# Patient Record
Sex: Male | Born: 1950 | Race: White | Hispanic: No | Marital: Married | Smoking: Never smoker
Health system: Southern US, Community
[De-identification: ages and names within clinical notes are randomized; demographics above are authoritative.]

---

## 2013-10-22 DIAGNOSIS — I10 Essential (primary) hypertension: Secondary | ICD-10-CM | POA: Insufficient documentation

## 2013-10-22 DIAGNOSIS — J309 Allergic rhinitis, unspecified: Secondary | ICD-10-CM | POA: Insufficient documentation

## 2014-04-22 DIAGNOSIS — Q809 Congenital ichthyosis, unspecified: Secondary | ICD-10-CM | POA: Insufficient documentation

## 2019-05-16 DIAGNOSIS — Z8719 Personal history of other diseases of the digestive system: Secondary | ICD-10-CM | POA: Insufficient documentation

## 2019-05-16 DIAGNOSIS — K7689 Other specified diseases of liver: Secondary | ICD-10-CM | POA: Insufficient documentation

## 2019-06-13 DIAGNOSIS — K8020 Calculus of gallbladder without cholecystitis without obstruction: Secondary | ICD-10-CM | POA: Insufficient documentation

## 2020-07-07 NOTE — Progress Notes (Signed)
[facility] [address] [place] [address] Phone: [phone] Subjective:   Clarence Walters, am serving as a scribe for Dr. Antoine Primas. This visit occurred during the SARS-CoV-2 public health emergency.  Safety protocols were in place, including screening questions prior to the visit, additional usage of staff PPE, and extensive cleaning of exam room while observing appropriate contact time as indicated for disinfecting solutions.    I'm seeing this patient by the request  of:  Woodroe Chen, MD  CC: Bilateral shoulder, hand and knee pain  PPI:[id number]  Clarence Walters is a 70 y.o. male coming in with complaint of R heel, B shoulder, B hand and knee pain. Patient states that his most pressing issue is his R heel pain. Pain his entire life but has gotten worse recently. Pain over medial aspect of calcaneous. Patient wears CROCS in the house. Stretching has not helped his pain. Has tried numerous arch supports that make his pain worse.   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 Had to have clavicle straps to hold shoulders in.   Pain over 5th metacarpal for 2 years after digging 280 feet in his yard. Has nodules along flexor tendons bilaterally.   Pain in both knees with deep knee bending and using ladders. L>R. Pain in L knee is on medial aspect. Wears brace on this knee for pain management.     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  Reviewed patient's most recent labs from outside facility, overall unremarkable.  No past medical history on file.  Social History   Socioeconomic History   Marital status: Married    Spouse name: Not on file   Number of children: Not on file   Years of education: Not on file   Highest education level: Not on file  Occupational History   Not on file  Tobacco Use   Smoking status: Not on file    Smokeless tobacco: Not on file  Substance and Sexual Activity   Alcohol use: Not on file   Drug use: Not on file   Sexual activity: Not on file  Other Topics Concern   Not on file  Social History Narrative   Not on file   Social Determinants of Health   Financial Resource Strain: Not on file  Food Insecurity: Not on file  Transportation Needs: Not on file  Physical Activity: Not on file  Stress: Not on file  Social Connections: Not on file   Not on File No family history on file.   Current Outpatient Medications (Cardiovascular):    amLODipine-atorvastatin (CADUET) 5-10 MG tablet, Take 1 tablet by mouth daily.   lisinopril (ZESTRIL) 40 MG tablet, Take 40 mg by mouth daily.      Reviewed prior external information including notes and imaging from  primary care provider As well as notes that were available from care everywhere and other healthcare systems.  Past medical history, social, surgical and family history all reviewed in electronic medical record.  No pertanent information unless stated regarding to the chief complaint.   Review of Systems:  No headache, visual changes, nausea, vomiting, diarrhea, constipation, dizziness, abdominal pain, skin rash, fevers, chills, night sweats, weight loss, swollen lymph nodes, joint swelling, chest pain, shortness of breath, mood changes. POSITIVE muscle aches, body aches  Objective  Blood pressure 120/86, pulse 73, height 5\' 11"  (1.803 m), weight 168 lb (76.2 kg), SpO2  97 %.   General: No apparent distress alert and oriented x3 mood and affect normal, dressed appropriately.  HEENT: Pupils equal, extraocular movements intact  Respiratory: Patient's speak in full sentences and does not appear short of breath  Cardiovascular: No lower extremity edema, non tender, no erythema  Gait normal with good balance and coordination.  MSK: Bilateral shoulders do show that patient does have excessive range of motion but patient does have  less range of motion on the left side.  Positive impingement noted.  Patient is in pain over the acromioclavicular joint noted.  Mild swelling noted compared to the contralateral side. Knee exam is fairly unremarkable with mild lateral tracking of the patella noted. Right heel exam severely tender to palpation over the plantar aspect of the heel.  Patient does have mild tightness noted of the posterior cord.  Limited muscular skeletal ultrasound was performed and interpreted by Antoine Primas, M  Limited ultrasound of patient's left shoulder shows rotator cuff does appear to be significantly intact.  Patient does have the potential findings of the glenohumeral head and neck that are consistent with a Hill-Sachs deformity. Patient does have moderate to severe narrowing of the acromioclavicular joint with mild effusion noted.  Regarding patient's foot does show that patient does have inflammation consistent with plantar fasciitis with hypoechoic changes.  Possible cortical irregularity noted of the calcaneal bone noted.  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    Impression and Recommendations:     The above documentation has been reviewed and is accurate and complete Judi Saa, DO

## 2020-07-08 ENCOUNTER — Other Ambulatory Visit: Payer: Self-pay

## 2020-07-08 ENCOUNTER — Encounter: Payer: Self-pay | Admitting: Family Medicine

## 2020-07-08 ENCOUNTER — Ambulatory Visit (INDEPENDENT_AMBULATORY_CARE_PROVIDER_SITE_OTHER): Payer: Medicare Other | Admitting: Family Medicine

## 2020-07-08 ENCOUNTER — Ambulatory Visit (INDEPENDENT_AMBULATORY_CARE_PROVIDER_SITE_OTHER): Payer: Medicare Other

## 2020-07-08 ENCOUNTER — Ambulatory Visit: Payer: Self-pay

## 2020-07-08 VITALS — BP 120/86 | HR 73 | Ht 71.0 in | Wt 168.0 lb

## 2020-07-08 DIAGNOSIS — M2550 Pain in unspecified joint: Secondary | ICD-10-CM | POA: Diagnosis not present

## 2020-07-08 DIAGNOSIS — M25571 Pain in right ankle and joints of right foot: Secondary | ICD-10-CM

## 2020-07-08 DIAGNOSIS — G8929 Other chronic pain: Secondary | ICD-10-CM | POA: Diagnosis not present

## 2020-07-08 DIAGNOSIS — M722 Plantar fascial fibromatosis: Secondary | ICD-10-CM

## 2020-07-08 DIAGNOSIS — M79642 Pain in left hand: Secondary | ICD-10-CM | POA: Diagnosis not present

## 2020-07-08 DIAGNOSIS — E559 Vitamin D deficiency, unspecified: Secondary | ICD-10-CM | POA: Diagnosis not present

## 2020-07-08 DIAGNOSIS — E038 Other specified hypothyroidism: Secondary | ICD-10-CM

## 2020-07-08 DIAGNOSIS — M79641 Pain in right hand: Secondary | ICD-10-CM

## 2020-07-08 DIAGNOSIS — M19019 Primary osteoarthritis, unspecified shoulder: Secondary | ICD-10-CM | POA: Insufficient documentation

## 2020-07-08 LAB — IBC PANEL
Iron: 112 ug/dL (ref 42–165)
Saturation Ratios: 45.5 % (ref 20.0–50.0)
Transferrin: 176 mg/dL — ABNORMAL LOW (ref 212.0–360.0)

## 2020-07-08 LAB — COMPREHENSIVE METABOLIC PANEL
ALT: 18 U/L (ref 0–53)
AST: 23 U/L (ref 0–37)
Albumin: 4.8 g/dL (ref 3.5–5.2)
Alkaline Phosphatase: 65 U/L (ref 39–117)
BUN: 17 mg/dL (ref 6–23)
CO2: 28 mEq/L (ref 19–32)
Calcium: 9.4 mg/dL (ref 8.4–10.5)
Chloride: 106 mEq/L (ref 96–112)
Creatinine, Ser: 1.33 mg/dL (ref 0.40–1.50)
GFR: 55.18 mL/min — ABNORMAL LOW (ref >60.00)
Glucose, Bld: 93 mg/dL (ref 70–99)
Potassium: 4.1 mEq/L (ref 3.5–5.1)
Sodium: 144 mEq/L (ref 135–145)
Total Bilirubin: 1.2 mg/dL (ref 0.2–1.2)
Total Protein: 7.4 g/dL (ref 6.0–8.3)

## 2020-07-08 LAB — CBC WITH DIFFERENTIAL/PLATELET
Basophils Absolute: 0 10*3/uL (ref 0.0–0.1)
Basophils Relative: 0.5 % (ref 0.0–3.0)
Eosinophils Absolute: 0 10*3/uL (ref 0.0–0.7)
Eosinophils Relative: 0.6 % (ref 0.0–5.0)
HCT: 47.4 % (ref 39.0–52.0)
Hemoglobin: 16.1 g/dL (ref 13.0–17.0)
Lymphocytes Relative: 21.9 % (ref 12.0–46.0)
Lymphs Abs: 1.4 10*3/uL (ref 0.7–4.0)
MCHC: 33.9 g/dL (ref 30.0–36.0)
MCV: 96.2 fl (ref 78.0–100.0)
Monocytes Absolute: 0.5 10*3/uL (ref 0.1–1.0)
Monocytes Relative: 7.9 % (ref 3.0–12.0)
Neutro Abs: 4.5 10*3/uL (ref 1.4–7.7)
Neutrophils Relative %: 69.1 % (ref 43.0–77.0)
Platelets: 181 10*3/uL (ref 150.0–400.0)
RBC: 4.92 Mil/uL (ref 4.22–5.81)
RDW: 13.8 % (ref 11.5–15.5)
WBC: 6.5 10*3/uL (ref 4.0–10.5)

## 2020-07-08 LAB — C-REACTIVE PROTEIN: CRP: <1 mg/dL (ref 0.5–20.0)

## 2020-07-08 LAB — URIC ACID: Uric Acid, Serum: 6.3 mg/dL (ref 4.0–7.8)

## 2020-07-08 LAB — VITAMIN D 25 HYDROXY (VIT D DEFICIENCY, FRACTURES): VITD: 52.76 ng/mL (ref 30.00–100.00)

## 2020-07-08 LAB — TESTOSTERONE: Testosterone: 376.09 ng/dL (ref 300.00–890.00)

## 2020-07-08 LAB — SEDIMENTATION RATE: Sed Rate: 4 mm/hr (ref 0–20)

## 2020-07-08 LAB — TSH: TSH: 1.52 u[IU]/mL (ref 0.35–5.50)

## 2020-07-08 NOTE — Assessment & Plan Note (Signed)
We reviewed that stretching is critically important to the treatment of PF.  Reviewed footwear. Rigid soles have been shown to help with PF. Night splints can sometimes help. Reviewed rehab of stretching and calf raises.   Could benefit from a corticosteroid injection, orthotics, or other measures if conservative treatment fails. Return to clinic in 6 weeks

## 2020-07-08 NOTE — Assessment & Plan Note (Signed)
Arthritis noted.  Patient will need home exercises.  Discussed changing range of motion.  Worsening pain will consider injection at follow-up

## 2020-07-08 NOTE — Assessment & Plan Note (Signed)
Patient is having multiple different joint pain.  Seems to be mostly of the shoulders.  We will get laboratory work-up to see if anything else is contributing.  Does have arthritic changes of the acromioclavicular joint as well as the plantar fascia.  Home exercises given for both of these today.  Discussed over-the-counter medications that I think will be beneficial and we will see what laboratory work-up shows.  Discussed over-the-counter natural supplementations as well as shoes.  Follow-up with me again in 4 to 6 weeks

## 2020-07-08 NOTE — Patient Instructions (Addendum)
Xray today:  R ankle HOKA or OOFOS sandals in the house Keep hands in peripheral vision Labs today Pennsaid 2x a day then use Voltaren Ice 20 min 2x a day See me in 6 weeks

## 2020-07-10 LAB — CALCIUM, IONIZED: Calcium, Ion: 4.84 mg/dL (ref 4.8–5.6)

## 2020-07-10 LAB — CYCLIC CITRUL PEPTIDE ANTIBODY, IGG: Cyclic Citrullin Peptide Ab: <16 UNITS

## 2020-07-10 LAB — PTH, INTACT AND CALCIUM
Calcium: 9.6 mg/dL (ref 8.6–10.3)
PTH: 32 pg/mL (ref 16–77)

## 2020-07-10 LAB — ANGIOTENSIN CONVERTING ENZYME: Angiotensin-Converting Enzyme: <5 U/L — ABNORMAL LOW (ref 9–67)

## 2020-07-10 LAB — RHEUMATOID FACTOR: Rheumatoid fact SerPl-aCnc: <14 IU/mL (ref <14)

## 2020-07-10 LAB — ANA: Anti Nuclear Antibody (ANA): NEGATIVE

## 2020-08-18 NOTE — Progress Notes (Signed)
[facility] [address] [place] [address] Phone: [phone] Subjective:   Clarence Walters, am serving as a scribe for Dr. Antoine Primas.  I'm seeing this patient by the request  of:  Woodroe Chen, MD  CC: Bilateral shoulder pain, hand pain and foot pain following  NOB:[id number]  07/08/2020 We reviewed that stretching is critically important to the treatment of PF.   Reviewed footwear. Rigid soles have been shown to help with PF. Night splints can sometimes help. Reviewed rehab of stretching and calf raises.    Could benefit from a corticosteroid injection, orthotics, or other measures if conservative treatment fails. Return to clinic in 6 weeks  Patient is having multiple different joint pain.  Seems to be mostly of the shoulders.  We will get laboratory work-up to see if anything else is contributing.  Does have arthritic changes of the acromioclavicular joint as well as the plantar fascia.  Home exercises given for both of these today.  Discussed over-the-counter medications that I think will be beneficial and we will see what laboratory work-up shows.  Discussed over-the-counter natural supplementations as well as shoes.  Follow-up with me again in 4 to 6 weeks  Update 08/19/2020 Clarence Walters is a 70 y.o. male coming in with complaint of B shoulder, B hand, R foot pain. Patient states the exercises for the shoulders do not help and he does them all the time. Patient states that the shoulders click. Still having the pain in the front of shoulders. Patient states that if he does heavy lifting is what makes it worse. Patient states the foot is still the same can only be on it for short periods of time. Patient is really concerned about his feet, would like to focus on getting those better, states he can tolerate the shoulders for now.        No past medical history on file. No past surgical history on file. Social History   Socioeconomic  History   Marital status: Married    Spouse name: Not on file   Number of children: Not on file   Years of education: Not on file   Highest education level: Not on file  Occupational History   Not on file  Tobacco Use   Smoking status: Not on file   Smokeless tobacco: Not on file  Substance and Sexual Activity   Alcohol use: Not on file   Drug use: Not on file   Sexual activity: Not on file  Other Topics Concern   Not on file  Social History Narrative   Not on file   Social Determinants of Health   Financial Resource Strain: Not on file  Food Insecurity: Not on file  Transportation Needs: Not on file  Physical Activity: Not on file  Stress: Not on file  Social Connections: Not on file   Not on File No family history on file.   Current Outpatient Medications (Cardiovascular):    amLODipine-atorvastatin (CADUET) 5-10 MG tablet, Take 1 tablet by mouth daily.   lisinopril (ZESTRIL) 40 MG tablet, Take 40 mg by mouth daily.       Review of Systems:  No headache, visual changes, nausea, vomiting, diarrhea, constipation, dizziness, abdominal pain, skin rash, fevers, chills, night sweats, weight loss, swollen lymph nodes, body aches, joint swelling, chest pain, shortness of breath, mood changes. POSITIVE muscle aches  Objective  Blood pressure 112/80, pulse 76, height 5\' 11"  (1.803 m), SpO2 98 %.   General:  No apparent distress alert and oriented x3 mood and affect normal, dressed appropriately.  HEENT: Pupils equal, extraocular movements intact  Respiratory: Patient's speak in full sentences and does not appear short of breath  Cardiovascular: No lower extremity edema, non tender, no erythema  Gait normal with good balance and coordination.  MSK: Shoulder exams bilaterally show positive impingement.  Positive crossover noted.  Foot exam does show the patient does have some mild breakdown of the longitudinal arch right greater than left.  Tender to palpation over the  plantar fascia and the medial calcaneal area.  Patient also has what appears to be a small nodule noted approximately 1 cm proximal to the metatarsal heads.  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  Tender to palpation in this area.  Mild tightness noted of the posterior cord of the ankle as well.  Hand exam shows the patient does have contractures noted of the fifth fingers but does have full extension and flexion.  Nodules noted at the A2 pulley.    Impression and Recommendations:     The above documentation has been reviewed and is accurate and complete Judi Saa, DO

## 2020-08-19 ENCOUNTER — Other Ambulatory Visit: Payer: Self-pay

## 2020-08-19 ENCOUNTER — Encounter: Payer: Self-pay | Admitting: Family Medicine

## 2020-08-19 ENCOUNTER — Ambulatory Visit (INDEPENDENT_AMBULATORY_CARE_PROVIDER_SITE_OTHER): Payer: Medicare Other | Admitting: Family Medicine

## 2020-08-19 VITALS — BP 112/80 | HR 76 | Ht 71.0 in

## 2020-08-19 DIAGNOSIS — M722 Plantar fascial fibromatosis: Secondary | ICD-10-CM | POA: Diagnosis not present

## 2020-08-19 DIAGNOSIS — M216X9 Other acquired deformities of unspecified foot: Secondary | ICD-10-CM | POA: Diagnosis not present

## 2020-08-19 DIAGNOSIS — M25511 Pain in right shoulder: Secondary | ICD-10-CM | POA: Diagnosis not present

## 2020-08-19 DIAGNOSIS — M19019 Primary osteoarthritis, unspecified shoulder: Secondary | ICD-10-CM

## 2020-08-19 DIAGNOSIS — M25512 Pain in left shoulder: Secondary | ICD-10-CM | POA: Diagnosis not present

## 2020-08-19 DIAGNOSIS — M720 Palmar fascial fibromatosis [Dupuytren]: Secondary | ICD-10-CM

## 2020-08-19 NOTE — Assessment & Plan Note (Signed)
Patient does have signs and symptoms consistent with plantar fasciitis but also potential fibroma noted.  We discussed the possibility of injection which patient declined.  Due to the breakdown of the longitudinal arch we will have patient be fitted for custom orthotics.  Discussed icing regimen.  We discussed avoiding being barefoot.  Follow-up with me again in 6 weeks and if continuing to have pain we will need to consider the possibility of injection or advanced imaging.

## 2020-08-19 NOTE — Assessment & Plan Note (Signed)
Known arthritic changes noted.  Patient continues to have impingement type findings as well.  Patient declined injection and would like to start with formal physical therapy which we will refer today.  Hopefully will make some progress with this.  Worsening pain consider ultrasound and possible injections at follow-up.

## 2020-08-19 NOTE — Assessment & Plan Note (Signed)
Starting in the hands bilaterally.  We will monitor.  May need to consider the possibility of injections at follow-up.

## 2020-08-19 NOTE — Patient Instructions (Addendum)
Orthotics-They will call you PT Weyman Pedro will call you Will continue to watch hands See me in 6-8 weeks

## 2020-08-21 ENCOUNTER — Other Ambulatory Visit: Payer: Self-pay

## 2020-08-21 ENCOUNTER — Encounter: Payer: Self-pay | Admitting: Rehabilitative and Restorative Service Providers"

## 2020-08-21 ENCOUNTER — Ambulatory Visit: Payer: Medicare Other | Attending: Family Medicine | Admitting: Rehabilitative and Restorative Service Providers"

## 2020-08-21 DIAGNOSIS — R262 Difficulty in walking, not elsewhere classified: Secondary | ICD-10-CM | POA: Insufficient documentation

## 2020-08-21 DIAGNOSIS — M6281 Muscle weakness (generalized): Secondary | ICD-10-CM | POA: Insufficient documentation

## 2020-08-21 DIAGNOSIS — M25571 Pain in right ankle and joints of right foot: Secondary | ICD-10-CM | POA: Diagnosis present

## 2020-08-21 DIAGNOSIS — M25511 Pain in right shoulder: Secondary | ICD-10-CM | POA: Diagnosis present

## 2020-08-21 DIAGNOSIS — M25612 Stiffness of left shoulder, not elsewhere classified: Secondary | ICD-10-CM | POA: Insufficient documentation

## 2020-08-21 DIAGNOSIS — G8929 Other chronic pain: Secondary | ICD-10-CM | POA: Insufficient documentation

## 2020-08-21 DIAGNOSIS — M25512 Pain in left shoulder: Secondary | ICD-10-CM | POA: Diagnosis present

## 2020-08-21 NOTE — Therapy (Signed)
[facility] [address] [place], [place], [address] Phone: [phone]   Fax:  [phone]  Physical Therapy Evaluation  Patient Details  Name: Clarence Walters MRN: [id number] Date of Birth: 1950-02-09 Referring Provider (PT): Dr Antoine Primas   Encounter Date: 08/21/2020   PT End of Session - 08/21/20 1157     Visit Number 1    Date for PT Re-Evaluation 11/06/20    PT Start Time 0800    PT Stop Time 0840    PT Time Calculation (min) 40 min    Activity Tolerance Patient tolerated treatment well    Behavior During Therapy [facility] for tasks assessed/performed             History reviewed. No pertinent past medical history.  History reviewed. No pertinent surgical history.  There were no vitals filed for this visit.    Subjective Assessment - 08/21/20 0807     Subjective Pt was born with elongated tendons and reports that his mother told him that as a child he wore clavicle straps to assist.  He states that a couple of years ago, a chiropractor was able to help with the shoulder pain.  He states that last October, after moving 12 yards of mulch, he started having more pain again.  He reports that his arm 'goes out' and he puts it back again.  He states that he has lost a lot of muscle mass in the past 10 years.  He reports that he has also been having some pain in his R foot.  He states that he has plantar fascitis with a referral to the orthototist on Monday for inserts.  He states that he used to be able to ambulate 4-6 miles, but now starts having pain if he walks 2 miles.    Pertinent History Per report, elongated tendons in shoulders    Limitations Lifting    Diagnostic tests Diagnostic ultrasound by Dr Katrinka Blazing    Patient Stated Goals To decrease shoulder pain and increase strength    Currently in Pain? Yes    Pain Score 3    Pain at worst 9/10   Pain Location Shoulder    Pain Orientation Right;Left   Left shoulder worse than  Right   Pain Descriptors / Indicators Throbbing    Pain Type Chronic pain    Pain Onset More than a month ago    Pain Frequency Intermittent                [facility] PT Assessment - 08/21/20 0001       Assessment   Medical Diagnosis B shoulder pain, unspecified chronicity    Referring Provider (PT) Dr Antoine Primas    Hand Dominance Right    Next MD Visit Sept 28, 2022    Prior Therapy Chiropractor years ago      Precautions   Precautions None      Restrictions   Weight Bearing Restrictions No      Balance Screen   Has the patient fallen in the past 6 months No    Has the patient had a decrease in activity level because of a fear of falling?  No    Is the patient reluctant to leave their home because of a fear of falling?  No      Home Environment   Living Environment Private residence    Living Arrangements Spouse/significant other    Type of Home House    Home Access  Stairs to enter    Entrance Stairs-Number of Steps 3    Home Layout Two level;Able to live on main level with bedroom/bathroom      Prior Function   Level of Independence Independent    Vocation Retired    Leisure Yard Work, Remodeling, visiting family      Cognition   Overall Cognitive Status Within Functional Limits for tasks assessed      Observation/Other Assessments   Focus on Therapeutic Outcomes (FOTO)  59%      ROM / Strength   AROM / PROM / Strength AROM;Strength      AROM   AROM Assessment Site Shoulder;Ankle    Right/Left Shoulder Right;Left    Right Shoulder Flexion 130 Degrees    Right Shoulder ABduction 126 Degrees    Left Shoulder Flexion 130 Degrees    Left Shoulder ABduction 116 Degrees    Right/Left Ankle Right;Left    Right Ankle Dorsiflexion 19    Right Ankle Plantar Flexion 55    Right Ankle Inversion 40    Right Ankle Eversion 30    Left Ankle Dorsiflexion 30    Left Ankle Plantar Flexion 55    Left Ankle Inversion 40    Left Ankle Eversion 35      Strength    Overall Strength Comments B shoulder strength of 4/5 except B IR/ER of 41minus/5 with pain   R foot strength grossly 4/5 throughout                       Objective measurements completed on examination: See above findings.       [facility] Adult PT Treatment/Exercise - 08/21/20 0001       Exercises   Exercises Shoulder;Ankle      Shoulder Exercises: Standing   External Rotation Strengthening;Both;20 reps    Theraband Level (Shoulder External Rotation) Level 2 (Red)    Other Standing Exercises 3 way scapular stabilization on wall x5 each B                      PT Short Term Goals - 08/21/20 1204       PT SHORT TERM GOAL #1   Title Pt will be independent with initial HEP.    Time 2    Period Weeks    Status New               PT Long Term Goals - 08/21/20 1205       PT LONG TERM GOAL #1   Title Pt will be independent with advanced HEP.    Time 12    Period Weeks    Status New      PT LONG TERM GOAL #2   Title Pt will have shoulder and R ankle ROM WFL to allow him to reach overhead and wash his back.    Time 12    Period Weeks    Status New      PT LONG TERM GOAL #3   Title Pt will have B shoulder and R ankle strength to increase to at least 4+/5 to allow him to do yard work.    Time 12    Period Weeks    Status New      PT LONG TERM GOAL #4   Title Patient will report being able to ambulate at least 3 miles with no increase of pain to return to his prior ambulation program.    Time 12  Period Weeks    Status New      PT LONG TERM GOAL #5   Title Patient will report at least a 50% improvement in symptoms when performing yard work or remodeling.    Time 12    Period Weeks    Status New                    Plan - 08/21/20 1158     Clinical Impression Statement Clarence Walters is a 70 y.o. male who presents to PT with a referral from Dr Antoine Primas secondary to B shoulder pain.  He also presents with reports of R plantar  fasciitis. His PLOF was independent without device and able to walk 4+ miles. He presents with decreased shoulder ROM, decreased R ankle dorsiflexion, muscle weakness, increased pain, and shoulder instabliity.  He states that he notices his pain most following heavy yard work or heavy lifting.  He is going to see an orthotist on Monday for evaluation for orthotics and is hoping that this will help his R foot pain.  He would benefit from skiled PT to address his functional impairments to allow him to be able to complete his desired activities without incresaed pain.    Personal Factors and Comorbidities Comorbidity 1    Comorbidities Plantar Fasciitis    Examination-Activity Limitations Locomotion Level    Examination-Participation Restrictions Community Activity;Yard Work    Conservation officer, historic buildings Evolving/Moderate complexity    Clinical Decision Making Moderate    Rehab Potential Good    PT Frequency 2x / week    PT Duration 12 weeks    PT Treatment/Interventions ADLs/Self Care Home Management;Cryotherapy;Electrical Stimulation;Iontophoresis 4mg /ml Dexamethasone;Moist Heat;Ultrasound;Gait training;Stair training;Functional mobility training;Therapeutic activities;Therapeutic exercise;Balance training;Neuromuscular re-education;Patient/family education;Manual techniques;Passive range of motion;Dry needling;Taping;Spinal Manipulations;Joint Manipulations    PT Next Visit Plan strengthening, ROM    PT Home Exercise Plan 3 way scapular stabilization with red tband, shoulder ER    Consulted and Agree with Plan of Care Patient             Patient will benefit from skilled therapeutic intervention in order to improve the following deficits and impairments:  Difficulty walking, Increased muscle spasms, Decreased activity tolerance, Pain, Impaired flexibility, Decreased range of motion, Decreased strength, Postural dysfunction, Improper body mechanics  Visit Diagnosis: Chronic left  shoulder pain - Plan: PT plan of care cert/re-cert  Chronic right shoulder pain - Plan: PT plan of care cert/re-cert  Pain in right ankle and joints of right foot - Plan: PT plan of care cert/re-cert  Muscle weakness (generalized) - Plan: PT plan of care cert/re-cert  Difficulty in walking, not elsewhere classified - Plan: PT plan of care cert/re-cert  Stiffness of left shoulder, not elsewhere classified - Plan: PT plan of care cert/re-cert     Problem List Patient Active Problem List   Diagnosis Date Noted   Dupuytren contracture 08/19/2020   Polyarthralgia 07/08/2020   Plantar fasciitis, right 07/08/2020   Acromioclavicular arthrosis 07/08/2020   Calculus of gallbladder 06/13/2019   History of pancreatitis 05/16/2019   Liver cyst 05/16/2019   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 [place], [place], [place] Phone: [phone]   Fax:  [phone]  Name: Clarence Walters MRN: [name] Date of Birth: [birth year]

## 2020-08-24 ENCOUNTER — Encounter: Payer: Self-pay | Admitting: Family Medicine

## 2020-08-24 ENCOUNTER — Ambulatory Visit (INDEPENDENT_AMBULATORY_CARE_PROVIDER_SITE_OTHER): Payer: Medicare Other | Admitting: Family Medicine

## 2020-08-24 ENCOUNTER — Other Ambulatory Visit: Payer: Self-pay

## 2020-08-24 DIAGNOSIS — M722 Plantar fascial fibromatosis: Secondary | ICD-10-CM | POA: Diagnosis present

## 2020-08-24 NOTE — Progress Notes (Signed)
PCP: Woodroe Chen, MD  Subjective:   HPI: Patient is a 70 y.o. male here for right foot pain.  Patient has been seen by Dr. Katrinka Blazing for this issue, referred to [place] to consider custom orthotics. He reports he's had problems with his feet for as long as he can remember. Current pain plantar right foot really worsened about 10 months ago. Feels pain plantar heel but also has a spot mid-arch that is very painful. Has tried multiple different shoes and inserts without success. Has been doing home stretching exercises, using KT tape across arch.  History reviewed. No pertinent past medical history.  Current Outpatient Medications on File Prior to Visit  Medication Sig Dispense Refill   amLODipine-atorvastatin (CADUET) 5-10 MG tablet Take 1 tablet by mouth daily.     lisinopril (ZESTRIL) 40 MG tablet Take 40 mg by mouth daily.     No current facility-administered medications on file prior to visit.    History reviewed. No pertinent surgical history.  Allergies  Allergen Reactions   Pseudoephedrine Itching    Social History   Socioeconomic History   Marital status: Married    Spouse name: Not on file   Number of children: Not on file   Years of education: Not on file   Highest education level: Not on file  Occupational History   Not on file  Tobacco Use   Smoking status: Never   Smokeless tobacco: Never  Substance and Sexual Activity   Alcohol use: Not on file   Drug use: Not on file   Sexual activity: Not on file  Other Topics Concern   Not on file  Social History Narrative   Not on file   Social Determinants of Health   Financial Resource Strain: Not on file  Food Insecurity: Not on file  Transportation Needs: Not on file  Physical Activity: Not on file  Stress: Not on file  Social Connections: Not on file  Intimate Partner Violence: Not on file    History reviewed. No pertinent family history.  Ht 5\' 11"  (1.803 m)   Wt 169 lb (76.7 kg)   BMI 23.57 kg/m    [facility] Adult Exercise 08/24/2020  Frequency of strengthening activities (# of days/week) 0    No flowsheet data found.  Review of Systems: See HPI above.     Objective:  Physical Exam:  Gen: NAD, comfortable in exam room  Right foot/ankle: Cavus.  Mild transverse arch collapse.  Small nodule within medial plantar fascia with tenderness.  No other gross deformity, swelling, ecchymoses FROM without pain. TTP medial calcaneus at plantar fascia insertion and within small nodule noted above. negativ ant drawer and negative talar tilt.   Negative syndesmotic compression. Thompsons test negative. NV intact distally.   Assessment & Plan:  1. 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  Discussed options with him today.  Arch support has never worked for him in the past due to pain within medial arch at small fibroma.  We made sports insoles today with cutout for this area which felt comfortable to him in the office - no increased pain at fibroma.  Encouraged stretches and shown eccentrics as well.  Continue with taping of arch.  Encouraged f/u with Dr. 08/26/2020.  He's also considering shockwave therapy for his chronic plantar fasciitis so we discussed this as well.

## 2020-08-24 NOTE — Patient Instructions (Signed)
You have plantar fasciitis Take tylenol and/or aleve as needed for pain  Plantar fascia stretch for 20-30 seconds (do 3 of these) in morning Lowering/raise on a step exercises 3 x 10 once or twice a day - this is very important for long term recovery. Can add heel walks, toe walks forward and backward as well Ice heel for 15 minutes as needed. Avoid flat shoes/barefoot walking as much as possible. Arch straps have been shown to help with pain or the KT taping. Inserts are important (dr. Jari Sportsman active series, spencos, our green insoles, custom orthotics). Steroid injection is a consideration for short term pain relief if you are struggling - consider this where you have the fibroma. Shockwave therapy is also an option, costs 60 dollars per treatment and usually done with 3-4 treatments - let me know if you want to do this and we will start it. Physical therapy is also an option.

## 2020-08-25 ENCOUNTER — Ambulatory Visit: Payer: Medicare Other | Admitting: Physical Therapy

## 2020-08-25 ENCOUNTER — Encounter: Payer: Self-pay | Admitting: Physical Therapy

## 2020-08-25 DIAGNOSIS — M25571 Pain in right ankle and joints of right foot: Secondary | ICD-10-CM

## 2020-08-25 DIAGNOSIS — M25512 Pain in left shoulder: Secondary | ICD-10-CM

## 2020-08-25 DIAGNOSIS — G8929 Other chronic pain: Secondary | ICD-10-CM

## 2020-08-25 DIAGNOSIS — M25612 Stiffness of left shoulder, not elsewhere classified: Secondary | ICD-10-CM

## 2020-08-25 DIAGNOSIS — M6281 Muscle weakness (generalized): Secondary | ICD-10-CM

## 2020-08-25 NOTE — Therapy (Signed)
[facility] [place] [address] [place], [place], [address] Phone: [phone]   Fax:  [phone]  Physical Therapy Treatment  Patient Details  Name: Clarence Walters MRN: [id number] Date of Birth: 1950-08-09 Referring Provider (PT): Dr Antoine Primas   Encounter Date: 08/25/2020   PT End of Session - 08/25/20 0838     Visit Number 2    Date for PT Re-Evaluation 11/06/20    PT Start Time 0800    PT Stop Time 0840    PT Time Calculation (min) 40 min    Activity Tolerance Patient tolerated treatment well    Behavior During Therapy [facility] for tasks assessed/performed             History reviewed. No pertinent past medical history.  History reviewed. No pertinent surgical history.  There were no vitals filed for this visit.   Subjective Assessment - 08/25/20 0802     Subjective Doing the exercises, cant ell the different in his shoulders    Currently in Pain? No/denies                               [facility] Adult PT Treatment/Exercise - 08/25/20 0001       Shoulder Exercises: Standing   External Rotation Strengthening;Both;20 reps;Theraband    Theraband Level (Shoulder External Rotation) Level 3 (Green)    Internal Rotation Strengthening;Both;20 reps;Theraband    Theraband Level (Shoulder Internal Rotation) Level 3 (Green)    Flexion Strengthening;Both;Weights;20 reps    Shoulder Flexion Weight (lbs) 3    Extension Weights;Strengthening;Both;20 reps    Extension Weight (lbs) 10    Other Standing Exercises Tricep Ext 25lb 2x15    Other Standing Exercises Curls 15lb 2x15      Shoulder Exercises: ROM/Strengthening   Nustep L5 x 6 min    Other ROM/Strengthening Exercises Rows & Lats 25lb 2x10                      PT Short Term Goals - 08/21/20 1204       PT SHORT TERM GOAL #1   Title Pt will be independent with initial HEP.    Time 2    Period Weeks    Status New               PT  Long Term Goals - 08/21/20 1205       PT LONG TERM GOAL #1   Title Pt will be independent with advanced HEP.    Time 12    Period Weeks    Status New      PT LONG TERM GOAL #2   Title Pt will have shoulder and R ankle ROM WFL to allow him to reach overhead and wash his back.    Time 12    Period Weeks    Status New      PT LONG TERM GOAL #3   Title Pt will have B shoulder and R ankle strength to increase to at least 4+/5 to allow him to do yard work.    Time 12    Period Weeks    Status New      PT LONG TERM GOAL #4   Title Patient will report being able to ambulate at least 3 miles with no increase of pain to return to his prior ambulation program.    Time 12    Period Weeks  Status New      PT LONG TERM GOAL #5   Title Patient will report at least a 50% improvement in symptoms when performing yard work or remodeling.    Time 12    Period Weeks    Status New                   Plan - 08/25/20 4403     Clinical Impression Statement Pt tolerated an initial progression to TE well. Cues to retract shoulders with seated rows. Some increase is shoulder fatigue with and burning with internal and external rotation. Pt reports a clicking in his shoulders with standing shoulder flexion and abduction.    Personal Factors and Comorbidities Comorbidity 1    Comorbidities Plantar Fasciitis    Examination-Activity Limitations Locomotion Level    Examination-Participation Restrictions Community Activity;Yard Work    Stability/Clinical Decision Making Evolving/Moderate complexity    Rehab Potential Good    PT Frequency 2x / week    PT Treatment/Interventions ADLs/Self Care Home Management;Cryotherapy;Electrical Stimulation;Iontophoresis 4mg /ml Dexamethasone;Moist Heat;Ultrasound;Gait training;Stair training;Functional mobility training;Therapeutic activities;Therapeutic exercise;Balance training;Neuromuscular re-education;Patient/family education;Manual techniques;Passive range  of motion;Dry needling;Taping;Spinal Manipulations;Joint Manipulations    PT Next Visit Plan strengthening, ROM             Patient will benefit from skilled therapeutic intervention in order to improve the following deficits and impairments:  Difficulty walking, Increased muscle spasms, Decreased activity tolerance, Pain, Impaired flexibility, Decreased range of motion, Decreased strength, Postural dysfunction, Improper body mechanics  Visit Diagnosis: Chronic left shoulder pain  Chronic right shoulder pain  Pain in right ankle and joints of right foot  Muscle weakness (generalized)  Stiffness of left shoulder, not elsewhere classified     Problem List Patient Active Problem List   Diagnosis Date Noted   Dupuytren contracture 08/19/2020   Polyarthralgia 07/08/2020   Plantar fasciitis, right 07/08/2020   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 [place], [place], [place] Phone: [phone]   Fax:  [phone]  Name: Clarence Walters MRN: [name] Date of Birth: August 04, 1950

## 2020-08-27 ENCOUNTER — Encounter: Payer: Self-pay | Admitting: Physical Therapy

## 2020-08-27 ENCOUNTER — Ambulatory Visit: Payer: Medicare Other | Admitting: Physical Therapy

## 2020-08-27 ENCOUNTER — Other Ambulatory Visit: Payer: Self-pay

## 2020-08-27 DIAGNOSIS — M25512 Pain in left shoulder: Secondary | ICD-10-CM | POA: Diagnosis not present

## 2020-08-27 DIAGNOSIS — G8929 Other chronic pain: Secondary | ICD-10-CM

## 2020-08-27 DIAGNOSIS — M6281 Muscle weakness (generalized): Secondary | ICD-10-CM

## 2020-08-27 NOTE — Therapy (Signed)
[facility] [place] [facility] [address] [place], [place], [address] Phone: [phone]   Fax:  [phone]  Physical Therapy Treatment  Patient Details  Name: Clarence Walters MRN: [id number] Date of Birth: [birth year] Referring Provider (PT): Dr Clarence Walters   Encounter Date: 08/27/2020   PT End of Session - 08/27/20 0841     Visit Number 3    Date for PT Re-Evaluation 11/06/20    PT Start Time 0800    PT Stop Time 0842    PT Time Calculation (min) 42 min    Activity Tolerance Patient tolerated treatment well    Behavior During Therapy [facility] for tasks assessed/performed             History reviewed. No pertinent past medical history.  History reviewed. No pertinent surgical history.  There were no vitals filed for this visit.   Subjective Assessment - 08/27/20 0800     Subjective Sore, three hours of "weed wiping"    Currently in Pain? No/denies                               [facility] Adult PT Treatment/Exercise - 08/27/20 0001       Shoulder Exercises: Seated   Other Seated Exercises Bent over rows, Ext, Rev flys 4lb 2x10 each      Shoulder Exercises: Standing   External Rotation Strengthening;Both;20 reps;Weights    External Rotation Weight (lbs) 5    Internal Rotation Strengthening;Both;20 reps;Weights    Internal Rotation Weight (lbs) 5    Flexion Strengthening;Both;Weights;10 reps    Shoulder Flexion Weight (lbs) 3    ABduction Strengthening;Both;10 reps;Weights    Shoulder ABduction Weight (lbs) 3    Extension Strengthening;Both;15 reps;Theraband   x2   Theraband Level (Shoulder Extension) Level 4 (Blue)    Row Strengthening;Both;15 reps;Weights   x2   Row Weight (lbs) 15    Other Standing Exercises Shoulder Flex/Abd combo 2x5      Shoulder Exercises: ROM/Strengthening   UBE (Upper Arm Bike) L2.4 x 3 min each                      PT Short Term Goals - 08/21/20 1204       PT SHORT  TERM GOAL #1   Title Pt will be independent with initial HEP.    Time 2    Period Weeks    Status New               PT Long Term Goals - 08/21/20 1205       PT LONG TERM GOAL #1   Title Pt will be independent with advanced HEP.    Time 12    Period Weeks    Status New      PT LONG TERM GOAL #2   Title Pt will have shoulder and R ankle ROM WFL to allow him to reach overhead and wash his back.    Time 12    Period Weeks    Status New      PT LONG TERM GOAL #3   Title Pt will have B shoulder and R ankle strength to increase to at least 4+/5 to allow him to do yard work.    Time 12    Period Weeks    Status New      PT LONG TERM GOAL #4   Title Patient will report being able  to ambulate at least 3 miles with no increase of pain to return to his prior ambulation program.    Time 12    Period Weeks    Status New      PT LONG TERM GOAL #5   Title Patient will report at least a 50% improvement in symptoms when performing yard work or remodeling.    Time 12    Period Weeks    Status New                   Plan - 08/27/20 6387     Clinical Impression Statement Pt reports being very active and having  some shoulder soreness today. Despite symptoms pt did really well with today's session. Some fatigue and weakness noted with the second set of shoulder internal and external rotation. Flexion and abduction combo did cause some shoulder fatigue. Reports of of shoulder clicking remains.    Personal Factors and Comorbidities Comorbidity 1    Comorbidities Plantar Fasciitis    Examination-Activity Limitations Locomotion Level    Examination-Participation Restrictions Community Activity;Yard Work    Rehab Potential Good    PT Frequency 2x / week    PT Treatment/Interventions ADLs/Self Care Home Management;Cryotherapy;Electrical Stimulation;Iontophoresis 4mg /ml Dexamethasone;Moist Heat;Ultrasound;Gait training;Stair training;Functional mobility training;Therapeutic  activities;Therapeutic exercise;Balance training;Neuromuscular re-education;Patient/family education;Manual techniques;Passive range of motion;Dry needling;Taping;Spinal Manipulations;Joint Manipulations    PT Next Visit Plan strengthening, ROM             Patient will benefit from skilled therapeutic intervention in order to improve the following deficits and impairments:  Difficulty walking, Increased muscle spasms, Decreased activity tolerance, Pain, Impaired flexibility, Decreased range of motion, Decreased strength, Postural dysfunction, Improper body mechanics  Visit Diagnosis: Chronic right shoulder pain  Chronic left shoulder pain  Muscle weakness (generalized)     Problem List Patient Active Problem List   Diagnosis Date Noted   Dupuytren contracture 08/19/2020   Polyarthralgia 07/08/2020   Plantar fasciitis, right 07/08/2020   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 [place], [place], [place] Phone: [phone]   Fax:  [phone]  Name: Clarence Walters MRN: [name] Date of Birth: August 14, 1950

## 2020-08-31 ENCOUNTER — Other Ambulatory Visit: Payer: Self-pay

## 2020-08-31 ENCOUNTER — Ambulatory Visit: Payer: Medicare Other | Admitting: Physical Therapy

## 2020-08-31 ENCOUNTER — Encounter: Payer: Self-pay | Admitting: Physical Therapy

## 2020-08-31 DIAGNOSIS — G8929 Other chronic pain: Secondary | ICD-10-CM

## 2020-08-31 DIAGNOSIS — M25612 Stiffness of left shoulder, not elsewhere classified: Secondary | ICD-10-CM

## 2020-08-31 DIAGNOSIS — M25512 Pain in left shoulder: Secondary | ICD-10-CM

## 2020-08-31 DIAGNOSIS — M25571 Pain in right ankle and joints of right foot: Secondary | ICD-10-CM

## 2020-08-31 DIAGNOSIS — M6281 Muscle weakness (generalized): Secondary | ICD-10-CM

## 2020-08-31 NOTE — Therapy (Signed)
[place]. [place], [place], [address] Phone: [phone]   Fax:  [phone]  Physical Therapy Treatment  Patient Details  Name: Clarence Walters MRN: [id number] Date of Birth: July 16, 1950 Referring Provider (PT): Dr Hulan Saas   Encounter Date: 08/31/2020   PT End of Session - 08/31/20 0837     Visit Number 4    Date for PT Re-Evaluation 11/06/20    PT Start Time 0757    PT Stop Time 0838    PT Time Calculation (min) 41 min    Activity Tolerance Patient tolerated treatment well    Behavior During Therapy [facility] for tasks assessed/performed             History reviewed. No pertinent past medical history.  History reviewed. No pertinent surgical history.  There were no vitals filed for this visit.   Subjective Assessment - 08/31/20 0758     Subjective not waking up with pain as much    Currently in Pain? No/denies                [facility] PT Assessment - 08/31/20 0001       AROM   Right Shoulder Flexion 145 Degrees    Right Shoulder ABduction 143 Degrees    Left Shoulder Flexion 153 Degrees    Left Shoulder ABduction 145 Degrees                           [facility] Adult PT Treatment/Exercise - 08/31/20 0001       Exercises   Exercises Ankle      Shoulder Exercises: Standing   External Rotation Strengthening;Both;20 reps;Theraband   abducted to 90   Theraband Level (Shoulder External Rotation) Level 2 (Red)    Extension Weights;Strengthening;Both;20 reps    Extension Weight (lbs) 10    Row Strengthening;Both;15 reps;Weights   x2   Row Weight (lbs) 15    Diagonals Strengthening;Both;20 reps;Theraband   D2 flex   Theraband Level (Shoulder Diagonals) Level 2 (Red)    Other Standing Exercises Shoulder Flex/Abd combo 3lb  2x5    Other Standing Exercises 3 way scap stabe red x10 each      Shoulder Exercises: ROM/Strengthening   UBE (Upper Arm Bike) L2.4 x 3 min each    Other  ROM/Strengthening Exercises Rows & Lats 35lb 2x10      Ankle Exercises: Stretches   Plantar Fascia Stretch Limitations 4 x15''                      PT Short Term Goals - 08/21/20 1204       PT SHORT TERM GOAL #1   Title Pt will be independent with initial HEP.    Time 2    Period Weeks    Status New               PT Long Term Goals - 08/31/20 0803       PT LONG TERM GOAL #2   Title Pt will have shoulder and R ankle ROM WFL to allow him to reach overhead and wash his back.    Status Partially Met      PT LONG TERM GOAL #5   Title Patient will report at least a 50% improvement in symptoms when performing yard work or remodeling.    Status Partially Met  Plan - 08/31/20 0837     Clinical Impression Statement Pt is doing well and is progression towards goals. Less shoulder pain when he sleeps allowing his to sleep in more. Pt has progressed increasing his bilateral shoulder AR OM. More intern se interventions selected today causing increase shoulder burning and fatigue. Postural cue needed with seated rows. Cramping in the L rhomboid area with shoulder extensions, the last intervention of the session    Personal Factors and Comorbidities Comorbidity 1    Comorbidities Plantar Fasciitis    Examination-Activity Limitations Locomotion Level    Examination-Participation Restrictions Community Activity;Yard Work    [employer] Evolving/Moderate complexity    Rehab Potential Good    PT Frequency 2x / week    PT Duration 12 weeks    PT Treatment/Interventions ADLs/Self Care Home Management;Cryotherapy;Electrical Stimulation;Iontophoresis 4mg /ml Dexamethasone;Moist Heat;Ultrasound;Gait training;Stair training;Functional mobility training;Therapeutic activities;Therapeutic exercise;Balance training;Neuromuscular re-education;Patient/family education;Manual techniques;Passive range of motion;Dry needling;Taping;Spinal  Manipulations;Joint Manipulations    PT Next Visit Plan strengthening, ROM             Patient will benefit from skilled therapeutic intervention in order to improve the following deficits and impairments:  Difficulty walking, Increased muscle spasms, Decreased activity tolerance, Pain, Impaired flexibility, Decreased range of motion, Decreased strength, Postural dysfunction, Improper body mechanics  Visit Diagnosis: Chronic right shoulder pain  Chronic left shoulder pain  Muscle weakness (generalized)  Pain in right ankle and joints of right foot  Stiffness of left shoulder, not elsewhere classified     Problem List Patient Active Problem List   Diagnosis Date Noted   Dupuytren contracture 08/19/2020   Polyarthralgia 07/08/2020   Plantar fasciitis, right 07/08/2020   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 [place], [place], [address] Phone: [phone]   Fax:  [phone]  Name: Clarence Walters MRN: [id number] Date of Birth: 21-Mar-1950

## 2020-09-03 ENCOUNTER — Ambulatory Visit: Payer: Medicare Other | Attending: Family Medicine | Admitting: Rehabilitative and Restorative Service Providers"

## 2020-09-03 ENCOUNTER — Other Ambulatory Visit: Payer: Self-pay

## 2020-09-03 ENCOUNTER — Encounter: Payer: Self-pay | Admitting: Rehabilitative and Restorative Service Providers"

## 2020-09-03 DIAGNOSIS — M6281 Muscle weakness (generalized): Secondary | ICD-10-CM | POA: Diagnosis present

## 2020-09-03 DIAGNOSIS — G8929 Other chronic pain: Secondary | ICD-10-CM | POA: Insufficient documentation

## 2020-09-03 DIAGNOSIS — M25612 Stiffness of left shoulder, not elsewhere classified: Secondary | ICD-10-CM | POA: Diagnosis present

## 2020-09-03 DIAGNOSIS — M25512 Pain in left shoulder: Secondary | ICD-10-CM | POA: Insufficient documentation

## 2020-09-03 DIAGNOSIS — R262 Difficulty in walking, not elsewhere classified: Secondary | ICD-10-CM | POA: Insufficient documentation

## 2020-09-03 DIAGNOSIS — M25511 Pain in right shoulder: Secondary | ICD-10-CM | POA: Diagnosis not present

## 2020-09-03 DIAGNOSIS — M25571 Pain in right ankle and joints of right foot: Secondary | ICD-10-CM | POA: Diagnosis present

## 2020-09-03 NOTE — Therapy (Signed)
[place]. [place], [place], [address] Phone: [phone]   Fax:  [phone]  Physical Therapy Treatment  Patient Details  Name: Clarence Walters MRN: [id number] Date of Birth: [birth year] Referring Provider (PT): Dr Hulan Saas   Encounter Date: 09/03/2020   PT End of Session - 09/03/20 0858     Visit Number 5    Date for PT Re-Evaluation 11/06/20    PT Start Time 0845    PT Stop Time 0925    PT Time Calculation (min) 40 min    Activity Tolerance Patient tolerated treatment well    Behavior During Therapy [facility] for tasks assessed/performed             History reviewed. No pertinent past medical history.  History reviewed. No pertinent surgical history.  There were no vitals filed for this visit.   Subjective Assessment - 09/03/20 0856     Subjective I hurt my shoulder on Tuesday.  Pt states that he was doing some housework including getting his heavy tools from a loft and when he did that, he hurt his shoulder.    Patient Stated Goals To decrease shoulder pain and increase strength    Currently in Pain? Yes    Pain Score 3     Pain Location Shoulder    Pain Orientation Right;Left                               [facility] Adult PT Treatment/Exercise - 09/03/20 0001       Shoulder Exercises: Standing   External Rotation Strengthening;Both;20 reps;Theraband    Theraband Level (Shoulder External Rotation) Level 2 (Red)    Internal Rotation Strengthening;Both;20 reps;Weights    Internal Rotation Weight (lbs) 5    Flexion Strengthening;Both;Weights;10 reps    Shoulder Flexion Weight (lbs) 3    ABduction Strengthening;Both;10 reps;Weights    Shoulder ABduction Weight (lbs) 3    Extension Weights;Strengthening;Both;20 reps    Extension Weight (lbs) 10    Row Strengthening;Both;15 reps;Weights   2x15   Row Weight (lbs) 15    Other Standing Exercises 3 way scap stabe red x10 each       Shoulder Exercises: ROM/Strengthening   UBE (Upper Arm Bike) L2.4 x 3 min each    Nustep L5 x 5 min    Wall Pushups 20 reps      Modalities   Modalities Iontophoresis      Iontophoresis   Type of Iontophoresis Dexamethasone    Location R shoulder    Dose 8m/mL    Time 4 hour patch      Ankle Exercises: Stretches   Gastroc Stretch 3 reps;20 seconds      Ankle Exercises: Standing   Heel Raises Both;20 reps;1 second                      PT Short Term Goals - 09/03/20 1018       PT SHORT TERM GOAL #1   Title Pt will be independent with initial HEP.    Status Achieved               PT Long Term Goals - 08/31/20 0803       PT LONG TERM GOAL #2   Title Pt will have shoulder and R ankle ROM WFL to allow him to reach overhead and wash his back.    Status Partially Met  PT LONG TERM GOAL #5   Title Patient will report at least a 50% improvement in symptoms when performing yard work or remodeling.    Status Partially Met                   Plan - 09/03/20 1011     Clinical Impression Statement Clarence Walters was having some increased pain today secondary to lifting heavy items out of a tall loft.  He did have some increased pain with scapular stabilization exercise and had to make some modifications with other exercises to not lift full range secondary to increased pain.  Explained to pt the uses of iontophoresis, and he wanted to try it to decrease his pain.  He requires continued PT to continue to progress towards goal related activities.    Personal Factors and Comorbidities Comorbidity 1    Comorbidities Plantar Fasciitis    PT Treatment/Interventions ADLs/Self Care Home Management;Cryotherapy;Electrical Stimulation;Iontophoresis 66m/ml Dexamethasone;Moist Heat;Ultrasound;Gait training;Stair training;Functional mobility training;Therapeutic activities;Therapeutic exercise;Balance training;Neuromuscular re-education;Patient/family education;Manual  techniques;Passive range of motion;Dry needling;Taping;Spinal Manipulations;Joint Manipulations    PT Next Visit Plan strengthening, ROM    Consulted and Agree with Plan of Care Patient             Patient will benefit from skilled therapeutic intervention in order to improve the following deficits and impairments:  Difficulty walking, Increased muscle spasms, Decreased activity tolerance, Pain, Impaired flexibility, Decreased range of motion, Decreased strength, Postural dysfunction, Improper body mechanics  Visit Diagnosis: Chronic right shoulder pain  Chronic left shoulder pain  Muscle weakness (generalized)  Pain in right ankle and joints of right foot  Stiffness of left shoulder, not elsewhere classified  Difficulty in walking, not elsewhere classified     Problem List Patient Active Problem List   Diagnosis Date Noted   Dupuytren contracture 08/19/2020   Polyarthralgia 07/08/2020   Plantar fasciitis, right 07/08/2020   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

## 2020-09-08 ENCOUNTER — Encounter: Payer: Self-pay | Admitting: Rehabilitative and Restorative Service Providers"

## 2020-09-08 ENCOUNTER — Ambulatory Visit: Payer: Medicare Other | Admitting: Rehabilitative and Restorative Service Providers"

## 2020-09-08 ENCOUNTER — Other Ambulatory Visit: Payer: Self-pay

## 2020-09-08 DIAGNOSIS — G8929 Other chronic pain: Secondary | ICD-10-CM

## 2020-09-08 DIAGNOSIS — M6281 Muscle weakness (generalized): Secondary | ICD-10-CM

## 2020-09-08 DIAGNOSIS — M25612 Stiffness of left shoulder, not elsewhere classified: Secondary | ICD-10-CM

## 2020-09-08 DIAGNOSIS — M25512 Pain in left shoulder: Secondary | ICD-10-CM

## 2020-09-08 DIAGNOSIS — R262 Difficulty in walking, not elsewhere classified: Secondary | ICD-10-CM

## 2020-09-08 DIAGNOSIS — M25511 Pain in right shoulder: Secondary | ICD-10-CM | POA: Diagnosis not present

## 2020-09-08 DIAGNOSIS — M25571 Pain in right ankle and joints of right foot: Secondary | ICD-10-CM

## 2020-09-08 NOTE — Therapy (Signed)
[place]. [place], [place], [address] Phone: [phone]   Fax:  [phone]  Physical Therapy Treatment  Patient Details  Name: Clarence Walters MRN: [id number] Date of Birth: 1950/01/20 Referring Provider (Clarence Walters): Dr Hulan Saas   Encounter Date: 09/08/2020   Clarence Walters End of Session - 09/08/20 0800     Visit Number 6    Date for Clarence Walters Re-Evaluation 11/06/20    Clarence Walters Start Time 0758    Clarence Walters Stop Time 0840    Clarence Walters Time Calculation (min) 42 min    Activity Tolerance Patient tolerated treatment well    Behavior During Therapy [facility] for tasks assessed/performed             History reviewed. No pertinent past medical history.  History reviewed. No pertinent surgical history.  There were no vitals filed for this visit.   Subjective Assessment - 09/08/20 0759     Subjective My shoulder hurts, but not as bad today.    Patient Stated Goals To decrease shoulder pain and increase strength    Currently in Pain? Yes    Pain Score 1     Pain Location Shoulder    Pain Orientation Right;Left    Pain Descriptors / Indicators Tender    Pain Type Chronic pain                               [facility] Adult Clarence Walters Treatment/Exercise - 09/08/20 0001       Shoulder Exercises: Standing   External Rotation Strengthening;Both;20 reps    External Rotation Weight (lbs) 5    Internal Rotation Strengthening;Both;20 reps;Weights    Internal Rotation Weight (lbs) 5    Flexion Strengthening;Both;Weights;10 reps    Shoulder Flexion Weight (lbs) 4    ABduction Strengthening;Both;10 reps;Weights    Shoulder ABduction Weight (lbs) 4    Extension Weights;Strengthening;Both;20 reps    Extension Weight (lbs) 10    Row Strengthening;Both;15 reps;Weights   2x15   Row Weight (lbs) 15    Other Standing Exercises 3 way scap stabe red x10 each      Shoulder Exercises: ROM/Strengthening   UBE (Upper Arm Bike) L2.4 x 3 min each    Wall Pushups  20 reps      Modalities   Modalities Iontophoresis      Iontophoresis   Type of Iontophoresis Dexamethasone    Location R shoulder    Dose $Rem'4mg'XINl$ /mL    Time 4 hour patch      Ankle Exercises: Stretches   Soleus Stretch 3 reps;20 seconds    Gastroc Stretch 3 reps;20 seconds      Ankle Exercises: Standing   SLS R SLS on AirEx 2x30 sec    Heel Raises Both;20 reps;1 second    Other Standing Ankle Exercises 8" step ups x10 bilat    Other Standing Ankle Exercises Squats on AirEx 2x10                      Clarence Walters Short Term Goals - 09/03/20 1018       Clarence Walters SHORT TERM GOAL #1   Title Clarence Walters will be independent with initial HEP.    Status Achieved               Clarence Walters Long Term Goals - 08/31/20 0803       Clarence Walters LONG TERM GOAL #2   Title Clarence Walters will have shoulder  and R ankle ROM WFL to allow him to reach overhead and wash his back.    Status Partially Met      Clarence Walters LONG TERM GOAL #5   Title Patient will report at least a 50% improvement in symptoms when performing yard work or remodeling.    Status Partially Met                   Plan - 09/08/20 0802     Clinical Impression Statement Clarence Walters reports that he has been able to sleep through the night since he started Clarence Walters and that his shoulder does not 'click' as much.  He also reports that his shoulder has not 'popped out of socket' again since coming to [facility].  He states that the iontopatch did seem to help speed up his recovery some.  He continues to make improvements with goal related activities and improved scapular and shoulder stability.  He requires cuing during standing exercises for improved posture and improved core stability.  Clarence Walters reports feeling a bigger stretch with soleus stretching as compared to gastroc.    Clarence Walters Treatment/Interventions ADLs/Self Care Home Management;Cryotherapy;Electrical Stimulation;Iontophoresis 4mg /ml Dexamethasone;Moist Heat;Ultrasound;Gait training;Stair training;Functional mobility  training;Therapeutic activities;Therapeutic exercise;Balance training;Neuromuscular re-education;Patient/family education;Manual techniques;Passive range of motion;Dry needling;Taping;Spinal Manipulations;Joint Manipulations    Clarence Walters Next Visit Plan strengthening, ROM    Consulted and Agree with Plan of Care Patient             Patient will benefit from skilled therapeutic intervention in order to improve the following deficits and impairments:  Difficulty walking, Increased muscle spasms, Decreased activity tolerance, Pain, Impaired flexibility, Decreased range of motion, Decreased strength, Postural dysfunction, Improper body mechanics  Visit Diagnosis: Chronic right shoulder pain  Chronic left shoulder pain  Muscle weakness (generalized)  Pain in right ankle and joints of right foot  Stiffness of left shoulder, not elsewhere classified  Difficulty in walking, not elsewhere classified     Problem List Patient Active Problem List   Diagnosis Date Noted   Dupuytren contracture 08/19/2020   Polyarthralgia 07/08/2020   Plantar fasciitis, right 07/08/2020   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 [place], [place], [address] Phone: [phone]   Fax:  [phone]  Name: Clarence Walters MRN: [id number] Date of Birth: [birth year]

## 2020-09-10 ENCOUNTER — Other Ambulatory Visit: Payer: Self-pay

## 2020-09-10 ENCOUNTER — Ambulatory Visit: Payer: Medicare Other | Admitting: Rehabilitative and Restorative Service Providers"

## 2020-09-10 ENCOUNTER — Encounter: Payer: Self-pay | Admitting: Rehabilitative and Restorative Service Providers"

## 2020-09-10 DIAGNOSIS — M25571 Pain in right ankle and joints of right foot: Secondary | ICD-10-CM

## 2020-09-10 DIAGNOSIS — M25511 Pain in right shoulder: Secondary | ICD-10-CM | POA: Diagnosis not present

## 2020-09-10 DIAGNOSIS — R262 Difficulty in walking, not elsewhere classified: Secondary | ICD-10-CM

## 2020-09-10 DIAGNOSIS — G8929 Other chronic pain: Secondary | ICD-10-CM

## 2020-09-10 DIAGNOSIS — M25612 Stiffness of left shoulder, not elsewhere classified: Secondary | ICD-10-CM

## 2020-09-10 DIAGNOSIS — M6281 Muscle weakness (generalized): Secondary | ICD-10-CM

## 2020-09-10 NOTE — Therapy (Signed)
[place]. [place], [place], [address] Phone: [phone]   Fax:  [phone]  Physical Therapy Treatment  Patient Details  Name: Clarence Walters MRN: [id number] Date of Birth: 02/19/1950 Referring Provider (PT): Dr Hulan Saas   Encounter Date: 09/10/2020   PT End of Session - 09/10/20 0819     Visit Number 7    Date for PT Re-Evaluation 11/06/20    PT Start Time 0800    PT Stop Time 0840    PT Time Calculation (min) 40 min    Activity Tolerance Patient tolerated treatment well    Behavior During Therapy [facility] for tasks assessed/performed             History reviewed. No pertinent past medical history.  History reviewed. No pertinent surgical history.  There were no vitals filed for this visit.   Subjective Assessment - 09/10/20 0811     Subjective I am feeling better.  Pt reports ionto patch helping.    Patient Stated Goals To decrease shoulder pain and increase strength    Currently in Pain? Yes    Pain Score 1     Pain Location Shoulder    Pain Orientation Right;Left                               [facility] Adult PT Treatment/Exercise - 09/10/20 0001       High Level Balance   High Level Balance Comments Tandem stance on AirEx with each leg leading.  2x20 sec      Shoulder Exercises: Standing   External Rotation Strengthening;Both;20 reps    External Rotation Weight (lbs) 5    Internal Rotation Strengthening;Both;20 reps;Weights    Internal Rotation Weight (lbs) 5    Flexion Strengthening;Both;Weights;10 reps    Shoulder Flexion Weight (lbs) 4    ABduction Strengthening;Both;10 reps;Weights    Shoulder ABduction Weight (lbs) 4    Extension Weights;Strengthening;Both;20 reps    Extension Weight (lbs) 15    Row Strengthening;Both;15 reps;Weights    Row Weight (lbs) 15    Other Standing Exercises 3 way scap stabe red x10 each      Shoulder Exercises: ROM/Strengthening   UBE  (Upper Arm Bike) L2.4 x 3 min each    Wall Pushups 20 reps      Ankle Exercises: Stretches   Soleus Stretch 2 reps;20 seconds    Gastroc Stretch 2 reps;20 seconds      Ankle Exercises: Standing   SLS R SLS on AirEx 2x30 sec    Heel Raises Both;20 reps;1 second    Other Standing Ankle Exercises 8" step ups x10 bilat    Other Standing Ankle Exercises Squats on AirEx 2x10                       PT Short Term Goals - 09/03/20 1018       PT SHORT TERM GOAL #1   Title Pt will be independent with initial HEP.    Status Achieved               PT Long Term Goals - 09/10/20 0845       PT LONG TERM GOAL #1   Title Pt will be independent with advanced HEP.    Status On-going      PT LONG TERM GOAL #2   Title Pt will have shoulder and R ankle ROM  WFL to allow him to reach overhead and wash his back.    Status Partially Met      PT LONG TERM GOAL #3   Title Pt will have B shoulder and R ankle strength to increase to at least 4+/5 to allow him to do yard work.    Status On-going      PT LONG TERM GOAL #4   Title Patient will report being able to ambulate at least 3 miles with no increase of pain to return to his prior ambulation program.    Status On-going      PT LONG TERM GOAL #5   Title Patient will report at least a 50% improvement in symptoms when performing yard work or remodeling.    Status Partially Met                   Plan - 09/10/20 0819     Clinical Impression Statement Clarence Walters continues to make progress towards goal related activities.  He is progressing with improved balance and stability of R ankle, so added tandem stance on AirEx today to assist with challenging his balance more.  He requires less cuing for posture today and was able to progress weights on some activities.  Pt was provided with blue theraband for HEP for when he goes to the beach next week.  He was educated on the importance of continuing HEP when he is out of town, he  verbalized his understanding.  He continues to require skilled PT to progress towards goal related activities.    PT Treatment/Interventions ADLs/Self Care Home Management;Cryotherapy;Electrical Stimulation;Iontophoresis 4mg /ml Dexamethasone;Moist Heat;Ultrasound;Gait training;Stair training;Functional mobility training;Therapeutic activities;Therapeutic exercise;Balance training;Neuromuscular re-education;Patient/family education;Manual techniques;Passive range of motion;Dry needling;Taping;Spinal Manipulations;Joint Manipulations    PT Next Visit Plan assess progress while pt was on vacation, balance, strengthening, ROM    Consulted and Agree with Plan of Care Patient             Patient will benefit from skilled therapeutic intervention in order to improve the following deficits and impairments:  Difficulty walking, Increased muscle spasms, Decreased activity tolerance, Pain, Impaired flexibility, Decreased range of motion, Decreased strength, Postural dysfunction, Improper body mechanics  Visit Diagnosis: Chronic right shoulder pain  Chronic left shoulder pain  Muscle weakness (generalized)  Pain in right ankle and joints of right foot  Stiffness of left shoulder, not elsewhere classified  Difficulty in walking, not elsewhere classified     Problem List Patient Active Problem List   Diagnosis Date Noted   Dupuytren contracture 08/19/2020   Polyarthralgia 07/08/2020   Plantar fasciitis, right 07/08/2020   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 [place], [place], [address] Phone: [phone]   Fax:  [phone]  Name: Clarence Walters MRN: [id number] Date of Birth:  05-30-1950

## 2020-09-22 ENCOUNTER — Other Ambulatory Visit: Payer: Self-pay

## 2020-09-22 ENCOUNTER — Encounter: Payer: Self-pay | Admitting: Physical Therapy

## 2020-09-22 ENCOUNTER — Ambulatory Visit: Payer: Medicare Other | Admitting: Physical Therapy

## 2020-09-22 DIAGNOSIS — M25571 Pain in right ankle and joints of right foot: Secondary | ICD-10-CM

## 2020-09-22 DIAGNOSIS — M6281 Muscle weakness (generalized): Secondary | ICD-10-CM

## 2020-09-22 DIAGNOSIS — M25512 Pain in left shoulder: Secondary | ICD-10-CM

## 2020-09-22 DIAGNOSIS — G8929 Other chronic pain: Secondary | ICD-10-CM

## 2020-09-22 DIAGNOSIS — M25511 Pain in right shoulder: Secondary | ICD-10-CM | POA: Diagnosis not present

## 2020-09-22 DIAGNOSIS — R262 Difficulty in walking, not elsewhere classified: Secondary | ICD-10-CM

## 2020-09-22 DIAGNOSIS — M25612 Stiffness of left shoulder, not elsewhere classified: Secondary | ICD-10-CM

## 2020-09-22 NOTE — Therapy (Signed)
[place]. [place], [place], [address] Phone: [phone]   Fax:  [phone]  Physical Therapy Treatment  Patient Details  Name: Cranston Koors MRN: [id number] Date of Birth: [birth year] Referring Provider (PT): Dr Hulan Saas   Encounter Date: 09/22/2020   PT End of Session - 09/22/20 0835     Visit Number 8    Date for PT Re-Evaluation 11/06/20    PT Start Time 0750    PT Stop Time 0836    PT Time Calculation (min) 46 min    Activity Tolerance Patient tolerated treatment well    Behavior During Therapy [facility] for tasks assessed/performed             History reviewed. No pertinent past medical history.  History reviewed. No pertinent surgical history.  There were no vitals filed for this visit.   Subjective Assessment - 09/22/20 0756     Subjective Shoulder is flared up again, washed a camper and swept the floor    Currently in Pain? Yes    Pain Score 3     Pain Location Shoulder    Pain Orientation Right;Lateral    Pain Descriptors / Indicators Sore    Aggravating Factors  sweeping pool, cleaning    Pain Relieving Factors the patch helped                               [facility] Adult PT Treatment/Exercise - 09/22/20 0001       Shoulder Exercises: Standing   External Rotation Strengthening;Both;20 reps;Theraband    Theraband Level (Shoulder External Rotation) Level 3 (Green)    Other Standing Exercises back tpo wall weighted ball overhead lift      Shoulder Exercises: ROM/Strengthening   UBE (Upper Arm Bike) L4x 3 min each    Lat Pull 2 plate;20 reps    Cybex Press 1.5 plate;20 reps    Cybex Row 2 plate;20 reps    Other ROM/Strengthening Exercises 15# facepulls    Other ROM/Strengthening Exercises straight arm extension 15# 2x10, 5# ER with shoulders abducted to 90 degrees      Manual Therapy   Manual Therapy Soft tissue mobilization;Passive ROM    Soft tissue mobilization  to the right deltoid and biceps    Passive ROM right shoulder ER/IR and fl;exion, right UE neural tension stretches      Ankle Exercises: Stretches   Soleus Stretch 2 reps;20 seconds    Gastroc Stretch 2 reps;20 seconds              Trigger Point Dry Needling - 09/22/20 0001     Consent Given? Yes    Education Handout Provided Yes    Muscles Treated Upper Quadrant Deltoid;Biceps    Deltoid Response Twitch response elicited;Palpable increased muscle length    Biceps Response Twitch response elicited;Palpable increased muscle length                     PT Short Term Goals - 09/03/20 1018       PT SHORT TERM GOAL #1   Title Pt will be independent with initial HEP.    Status Achieved               PT Long Term Goals - 09/22/20 0837       PT LONG TERM GOAL #1   Title Pt will be independent with advanced HEP.  Status Partially Met      PT LONG TERM GOAL #2   Title Pt will have shoulder and R ankle ROM WFL to allow him to reach overhead and wash his back.    Status Partially Met                   Plan - 09/22/20 0836     Clinical Impression Statement I added dry needles to the right biceps and deltoid area, with good LTR's, I added some increased machines for stabilization, I also did some right UE neural tension stretches, he is very tight into IR and tight in the pec.  See how he responds, he is very active doing washing and waxing a larg RV    PT Next Visit Plan see how he responds    Consulted and Agree with Plan of Care Patient             Patient will benefit from skilled therapeutic intervention in order to improve the following deficits and impairments:  Difficulty walking, Increased muscle spasms, Decreased activity tolerance, Pain, Impaired flexibility, Decreased range of motion, Decreased strength, Postural dysfunction, Improper body mechanics  Visit Diagnosis: Chronic right shoulder pain  Chronic left shoulder  pain  Muscle weakness (generalized)  Pain in right ankle and joints of right foot  Stiffness of left shoulder, not elsewhere classified  Difficulty in walking, not elsewhere classified     Problem List Patient Active Problem List   Diagnosis Date Noted   Dupuytren contracture 08/19/2020   Polyarthralgia 07/08/2020   Plantar fasciitis, right 07/08/2020   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 [place], [place], [address] Phone: [phone]   Fax:  [phone]  Name: Donnel Venuto MRN: [id number] Date of Birth: May 25, 1950

## 2020-09-24 ENCOUNTER — Other Ambulatory Visit: Payer: Self-pay

## 2020-09-24 ENCOUNTER — Ambulatory Visit: Payer: Medicare Other | Admitting: Physical Therapy

## 2020-09-24 ENCOUNTER — Encounter: Payer: Self-pay | Admitting: Physical Therapy

## 2020-09-24 DIAGNOSIS — M6281 Muscle weakness (generalized): Secondary | ICD-10-CM

## 2020-09-24 DIAGNOSIS — G8929 Other chronic pain: Secondary | ICD-10-CM

## 2020-09-24 DIAGNOSIS — M25511 Pain in right shoulder: Secondary | ICD-10-CM | POA: Diagnosis not present

## 2020-09-24 DIAGNOSIS — M25512 Pain in left shoulder: Secondary | ICD-10-CM

## 2020-09-24 NOTE — Therapy (Signed)
[facility] [place], [place], [address] Phone: [phone]   Fax:  [phone]  Physical Therapy Treatment  Patient Details  Name: Clarence Walters MRN: [id number] Date of Birth: October 08, 1950 Referring Provider (PT): Dr Hulan Saas   Encounter Date: 09/24/2020   PT End of Session - 09/24/20 0832     Visit Number 9    Date for PT Re-Evaluation 11/06/20    PT Start Time 0758    PT Stop Time 0843    PT Time Calculation (min) 45 min    Activity Tolerance Patient tolerated treatment well    Behavior During Therapy [facility] for tasks assessed/performed             History reviewed. No pertinent past medical history.  History reviewed. No pertinent surgical history.  There were no vitals filed for this visit.   Subjective Assessment - 09/24/20 0758     Subjective "Better" DN really helped    Currently in Pain? Yes    Pain Score 3     Pain Location Shoulder    Pain Orientation Right                               [facility] Adult PT Treatment/Exercise - 09/24/20 0001       Shoulder Exercises: Standing   External Rotation Strengthening;Both;20 reps    External Rotation Weight (lbs) 5    Internal Rotation Strengthening;Both;20 reps;Weights    Internal Rotation Weight (lbs) 5    Flexion Strengthening;Both;Weights;10 reps    Shoulder Flexion Weight (lbs) 4    ABduction Strengthening;Both;10 reps;Weights    Shoulder ABduction Weight (lbs) 4    Extension Strengthening;Both;20 reps;Theraband    Theraband Level (Shoulder Extension) Level 4 (Blue)    Other Standing Exercises back to wall weighted ball overhead lift      Shoulder Exercises: ROM/Strengthening   UBE (Upper Arm Bike) L4x 3 min each    Lat Pull 20 reps   25lb   Cybex Press 1.5 plate;20 reps    Cybex Row 20 reps   25lb     Manual Therapy   Manual Therapy Soft tissue mobilization;Passive ROM    Soft tissue mobilization to the right  deltoid and biceps    Passive ROM right shoulder ER/IR and fl;exion, right UE neural tension stretches              Trigger Point Dry Needling - 09/24/20 0001     Consent Given? Yes    Muscles Treated Head and Neck Upper trapezius    Upper Trapezius Response Twitch reponse elicited    Deltoid Response Twitch response elicited;Palpable increased muscle length    Biceps Response Twitch response elicited;Palpable increased muscle length                     PT Short Term Goals - 09/03/20 1018       PT SHORT TERM GOAL #1   Title Pt will be independent with initial HEP.    Status Achieved               PT Long Term Goals - 09/22/20 0837       PT LONG TERM GOAL #1   Title Pt will be independent with advanced HEP.    Status Partially Met      PT LONG TERM GOAL #2   Title Pt will have shoulder and R  ankle ROM WFL to allow him to reach overhead and wash his back.    Status Partially Met                   Plan - 09/24/20 0833     Clinical Impression Statement Pt enters clinic reporting improvement form DN last session, stating he could lift his arm up now. Attempted standing shoulder extensions but discontinued due cramping in the L upper trap. All other interventions completed well. Cues to prevent shoulder elevation with flexion and abduction. Some difficulty reported with overhead reaches with weighted ball. Lead PT assisted in treatment providing DN.    Personal Factors and Comorbidities Comorbidity 1    Comorbidities Plantar Fasciitis    Examination-Activity Limitations Locomotion Level    Examination-Participation Restrictions Community Activity;Yard Work    Rehab Potential Good    PT Frequency 2x / week    PT Duration 12 weeks    PT Treatment/Interventions ADLs/Self Care Home Management;Cryotherapy;Electrical Stimulation;Iontophoresis 68m/ml Dexamethasone;Moist Heat;Ultrasound;Gait training;Stair training;Functional mobility training;Therapeutic  activities;Therapeutic exercise;Balance training;Neuromuscular re-education;Patient/family education;Manual techniques;Passive range of motion;Dry needling;Taping;Spinal Manipulations;Joint Manipulations    PT Next Visit Plan see how he responds, progress with UE strengthening             Patient will benefit from skilled therapeutic intervention in order to improve the following deficits and impairments:  Difficulty walking, Increased muscle spasms, Decreased activity tolerance, Pain, Impaired flexibility, Decreased range of motion, Decreased strength, Postural dysfunction, Improper body mechanics  Visit Diagnosis: Muscle weakness (generalized)  Chronic left shoulder pain  Chronic right shoulder pain     Problem List Patient Active Problem List   Diagnosis Date Noted   Dupuytren contracture 08/19/2020   Polyarthralgia 07/08/2020   Plantar fasciitis, right 07/08/2020   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

## 2020-09-29 NOTE — Progress Notes (Signed)
[facility] [address] [place] [address] Phone: [phone] Subjective:   Clarence Walters, am serving as a scribe for Dr. Antoine Primas. This visit occurred during the SARS-CoV-2 public health emergency.  Safety protocols were in place, including screening questions prior to the visit, additional usage of staff PPE, and extensive cleaning of exam room while observing appropriate contact time as indicated for disinfecting solutions.    I'm seeing this patient by the request  of:  Woodroe Chen, MD  CC: Multiple complaints follow-up  YSA:[id number]  08/19/2020 Starting in the hands bilaterally.  We will monitor.  May need to consider the possibility of injections at follow-up.  Known arthritic changes noted.  Patient continues to have impingement type findings as well.  Patient declined injection and would like to start with formal physical therapy which we will refer today.  Hopefully will make some progress with this.  Worsening pain consider ultrasound and possible injections at follow-up.  Patient does have signs and symptoms consistent with plantar fasciitis but also potential fibroma noted.  We discussed the possibility of injection which patient declined.  Due to the breakdown of the longitudinal arch we will have patient be fitted for custom orthotics.  Discussed icing regimen.  We discussed avoiding being barefoot.  Follow-up with me again in 6 weeks and if continuing to have pain we will need to consider the possibility of injection or advanced imaging.  Update 09/30/2020 Clarence Walters is a 70 y.o. male coming in with complaint of B hand, R feet, B shoulder. Orthotics made for patient at end of August. Patient states that he has good and bad days with the plantar fascial pain. Performs myofasical release and is not waking up in pain but has pain when on his feet for prolonged periods.   Hand pain continues with use. 5th finger remains tight.  B  shoulder pain is improving after physical therapy. Relief from dry needling. Is able to sleep better now that he has had this treatment. Shoulders not as bothersome with with work that he does.        No past medical history on file. No past surgical history on file. Social History   Socioeconomic History   Marital status: Married    Spouse name: Not on file   Number of children: Not on file   Years of education: Not on file   Highest education level: Not on file  Occupational History   Not on file  Tobacco Use   Smoking status: Never   Smokeless tobacco: Never  Substance and Sexual Activity   Alcohol use: Not on file   Drug use: Not on file   Sexual activity: Not on file  Other Topics Concern   Not on file  Social History Narrative   Not on file   Social Determinants of Health   Financial Resource Strain: Not on file  Food Insecurity: Not on file  Transportation Needs: Not on file  Physical Activity: Not on file  Stress: Not on file  Social Connections: Not on file   Allergies  Allergen Reactions   Pseudoephedrine Itching   No family history on file.   Current Outpatient Medications (Cardiovascular):    amLODipine-atorvastatin (CADUET) 5-10 MG tablet, Take 1 tablet by mouth daily.   lisinopril (ZESTRIL) 40 MG tablet, Take 40 mg by mouth daily.      Reviewed prior external information including notes and imaging from  primary care provider As well as  notes that were available from care everywhere and other healthcare systems.  Past medical history, social, surgical and family history all reviewed in electronic medical record.  No pertanent information unless stated regarding to the chief complaint.   Review of Systems:  No headache, visual changes, nausea, vomiting, diarrhea, constipation, dizziness, abdominal pain, skin rash, fevers, chills, night sweats, weight loss, swollen lymph nodes, body aches, joint swelling, chest pain, shortness of breath, mood  changes. POSITIVE muscle aches  Objective  Blood pressure 132/86, pulse 70, height 5\' 11"  (1.803 m), weight 171 lb (77.6 kg), SpO2 99 %.   General: No apparent distress alert and oriented x3 mood and affect normal, dressed appropriately.  HEENT: Pupils equal, extraocular movements intact  Respiratory: Patient's speak in full sentences and does not appear short of breath  Cardiovascular: No lower extremity edema, non tender, no erythema  Gait normal with good balance and coordination.  MSK: Shoulder exam shows the patient does have significant improvement in range of motion.  5 out of 5 strength noted today. No difficulty and no tenderness on exam Patient's right foot does still have pain over the plantar fascia at the calcaneal area.  Patient does have very mild tightness of the Achilles tendon compared to the contralateral side.  Neurovascularly intact distally. 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  Minorly tender to palpation.  Limited muscular skeletal ultrasound was performed and interpreted by , M  Limited ultrasound of patient's right heel shows the patient does have some hypoechoic changes noted of the plantar fascia noted.  Definitely does have some enlargement.  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

## 2020-09-30 ENCOUNTER — Other Ambulatory Visit: Payer: Self-pay

## 2020-09-30 ENCOUNTER — Encounter: Payer: Self-pay | Admitting: Family Medicine

## 2020-09-30 ENCOUNTER — Ambulatory Visit: Payer: Self-pay

## 2020-09-30 ENCOUNTER — Ambulatory Visit (INDEPENDENT_AMBULATORY_CARE_PROVIDER_SITE_OTHER): Payer: Medicare Other | Admitting: Family Medicine

## 2020-09-30 VITALS — BP 132/86 | HR 70 | Ht 71.0 in | Wt 171.0 lb

## 2020-09-30 DIAGNOSIS — M25511 Pain in right shoulder: Secondary | ICD-10-CM

## 2020-09-30 DIAGNOSIS — G8929 Other chronic pain: Secondary | ICD-10-CM

## 2020-09-30 DIAGNOSIS — M720 Palmar fascial fibromatosis [Dupuytren]: Secondary | ICD-10-CM | POA: Diagnosis not present

## 2020-09-30 DIAGNOSIS — M19019 Primary osteoarthritis, unspecified shoulder: Secondary | ICD-10-CM

## 2020-09-30 DIAGNOSIS — M25512 Pain in left shoulder: Secondary | ICD-10-CM | POA: Diagnosis not present

## 2020-09-30 DIAGNOSIS — M722 Plantar fascial fibromatosis: Secondary | ICD-10-CM

## 2020-09-30 NOTE — Patient Instructions (Signed)
Keep watching fingers Do think if worsening may consider surgery Shoulder looks great Graston tool to the foot See me again in December just in case

## 2020-09-30 NOTE — Assessment & Plan Note (Signed)
Discussed with patient again.  Patient did do a significant project in his house as well though where patient did do 3 months of heavy manual labor that likely exacerbated this.  We discussed the potential for injections for him because of the severity 1 to consider surgical intervention.  At this point patient will continue to monitor.

## 2020-09-30 NOTE — Assessment & Plan Note (Signed)
Significant improvement with range of motion.  Patient is not having any significant pain with the throwing motion he was having previously.  At this point we will continue to monitor but will have follow-up again in 2 to 3 months

## 2020-09-30 NOTE — Assessment & Plan Note (Signed)
Patient has made some progress.  We discussed posture ergonomics.  Discussed which activities to do which wants to avoid.  We discussed the proper shoes still.  Patient wants to continue to monitor and declined any type of injection.  Discussed nitroglycerin as well as PRP of the other treatment options.  Patient will be following up with me again in December when he is back from [place].

## 2020-10-06 ENCOUNTER — Ambulatory Visit: Payer: Medicare Other | Admitting: Physical Therapy

## 2020-10-08 ENCOUNTER — Ambulatory Visit: Payer: Medicare Other | Admitting: Physical Therapy

## 2021-04-16 ENCOUNTER — Encounter: Payer: Self-pay | Admitting: Physical Therapy

## 2021-04-16 NOTE — Therapy (Signed)
[place] ?[facility] ?[address] ?[place], [place], [address] ?Phone: [phone]   Fax:  [phone] ? ?Patient Details  ?Name: Clarence Walters ?MRN: [id number] ?Date of Birth: 04/03/1950 ?Referring Provider:  No ref. provider found ? ?Encounter Date: 04/16/2021 ? ?PHYSICAL THERAPY DISCHARGE SUMMARY ? ?Visits from Start of Care: 9 ? ?Current functional level related to goals / functional outcomes: ?Did not return since last visit  ?  ?Remaining deficits: ?Unable to assess  ?  ?Education / Equipment: ?Unable to assess   ? ?Patient agrees to discharge. Patient goals were partially met. Patient is being discharged due to not returning since the last visit. ? ? ?Kristen U PT, DPT, PN2  ? ?Supplemental Physical Therapist ?[facility]  ? ? ? ? ? ?[place] ?[facility] ?[address] ?[place], [place], [address] ?Phone: [phone]   Fax:  [phone] ?

## 2023-02-02 IMAGING — DX DG ANKLE COMPLETE 3+V*R*
3 series · 3 of 3 positions shown · non-contrast
Comparison: None.

CLINICAL DATA: Right heel/ankle pain 6 months.  No injury.

EXAM:
RIGHT ANKLE - COMPLETE 3+ VIEW

[ankle ap]
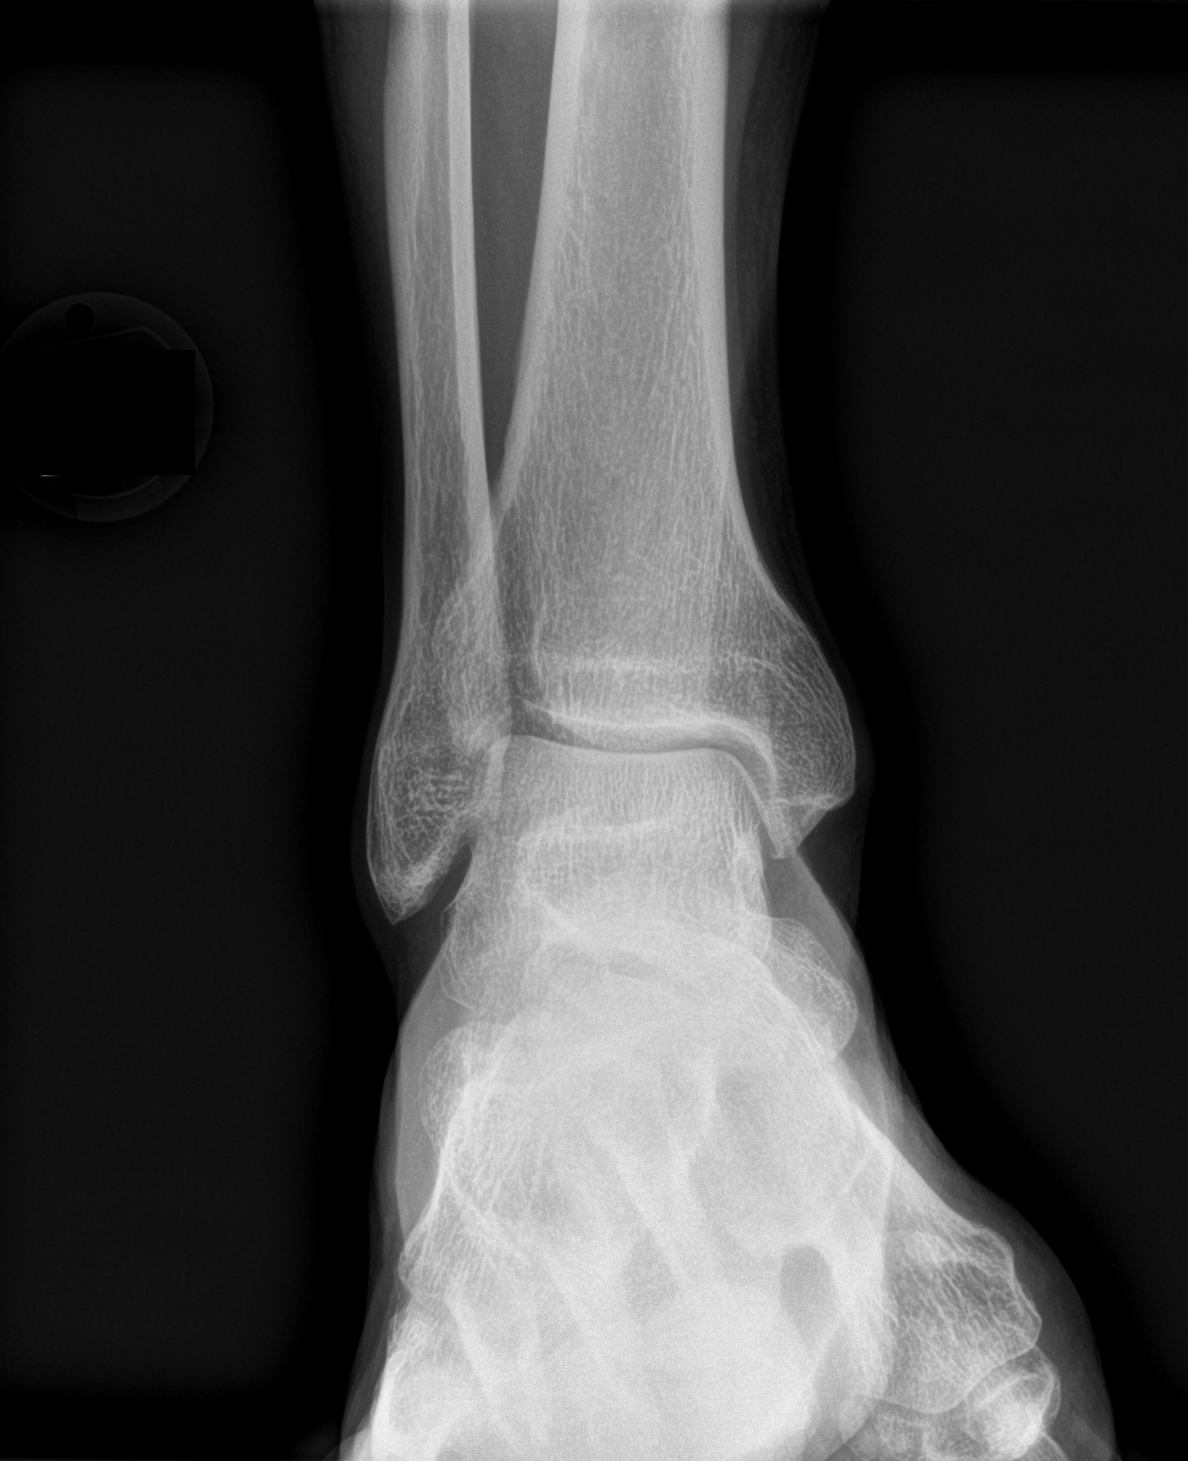

[ankle obl]
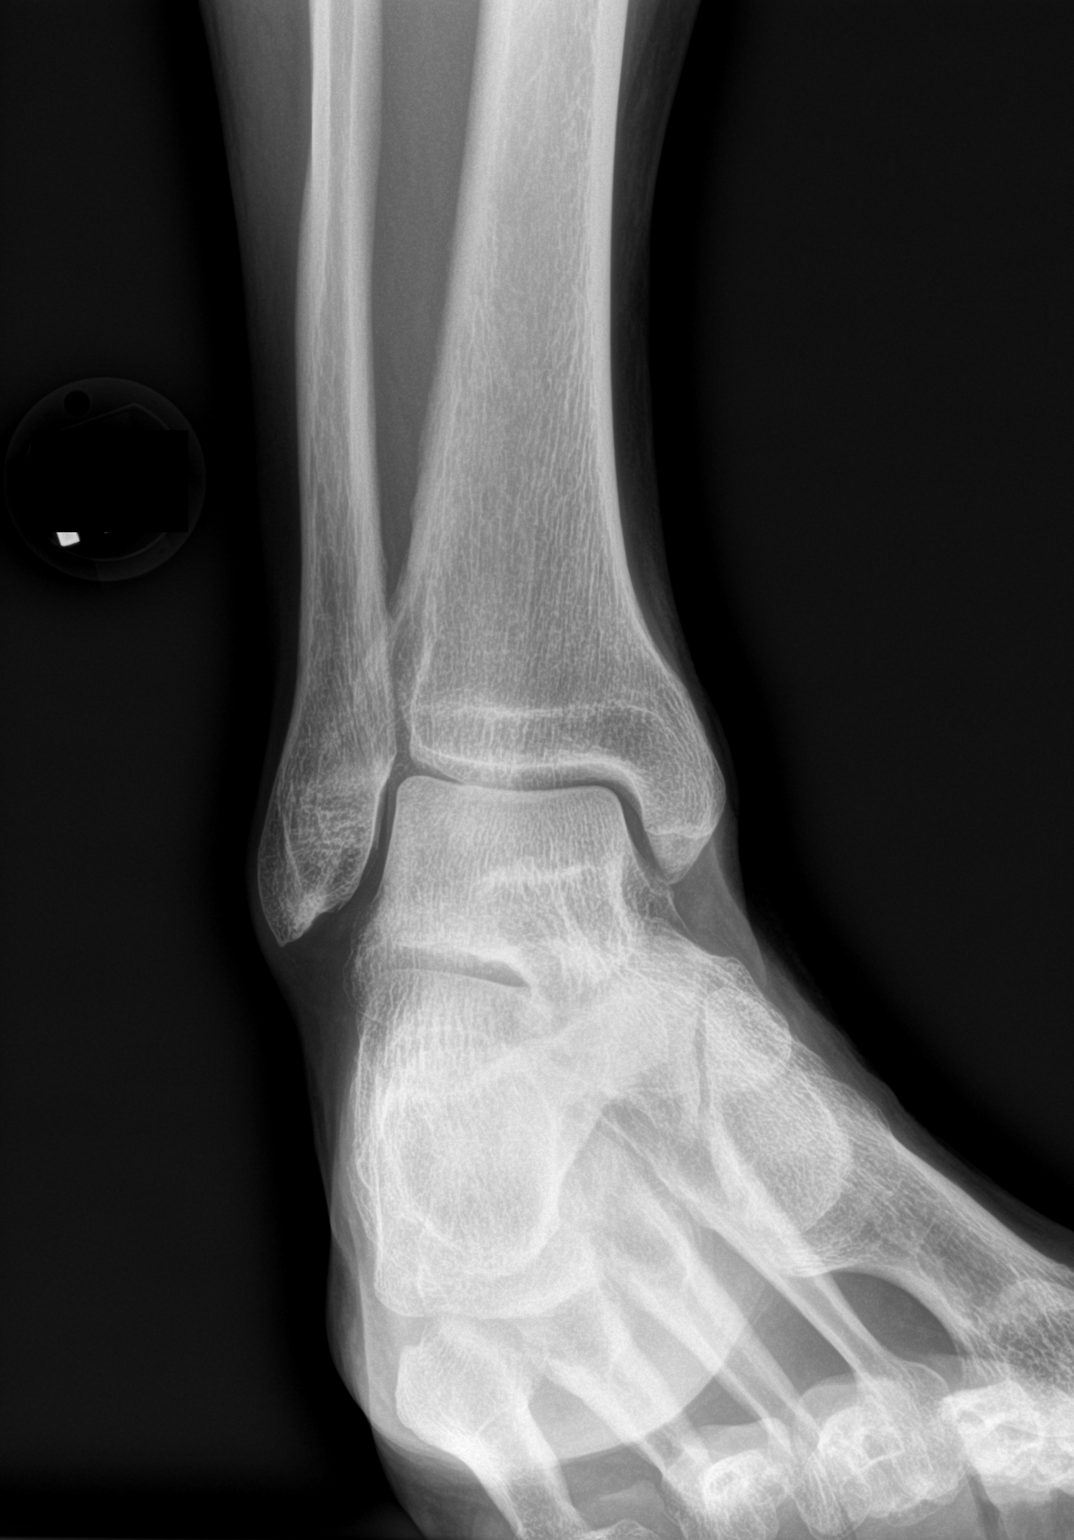

[ankle lat]
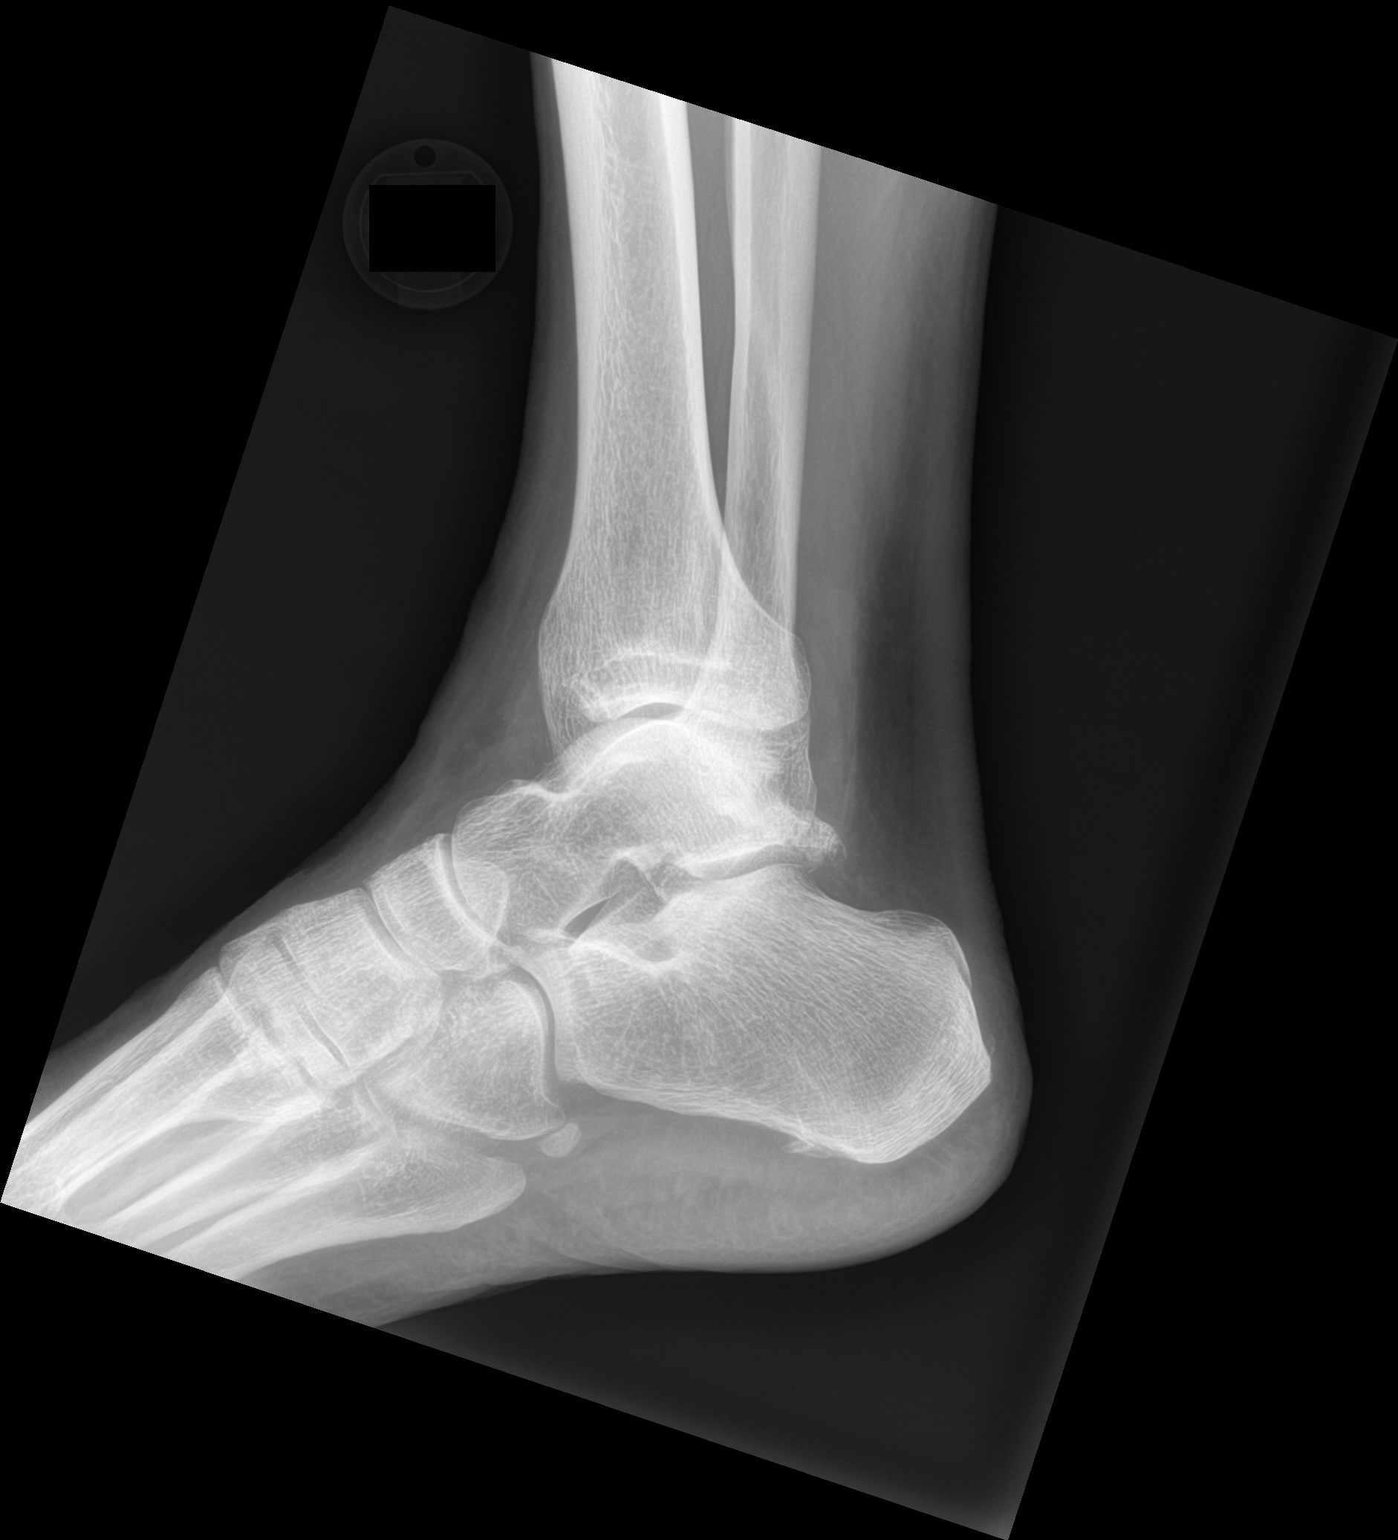

[3 of 3 positions shown; findings below may reference images not displayed]

FINDINGS: Ankle mortise is normal. No acute fracture or dislocation. Mild
degenerate change over the posterior subtalar joint. Small inferior
calcaneal spur.
IMPRESSION: 1. No acute findings.
2. Small inferior calcaneal spur.
